# Patient Record
Sex: Male | Born: 1985
Health system: Southern US, Community
[De-identification: ages and names within clinical notes are randomized; demographics above are authoritative.]

## PROBLEM LIST (undated history)

## (undated) DIAGNOSIS — K219 Gastro-esophageal reflux disease without esophagitis: Secondary | ICD-10-CM

## (undated) DIAGNOSIS — Q283 Other malformations of cerebral vessels: Secondary | ICD-10-CM

## (undated) DIAGNOSIS — F419 Anxiety disorder, unspecified: Secondary | ICD-10-CM

## (undated) HISTORY — DX: Other malformations of cerebral vessels: Q28.3

## (undated) HISTORY — DX: Anxiety disorder, unspecified: F41.9

---
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
## 2003-04-13 ENCOUNTER — Encounter: Payer: Self-pay | Admitting: Emergency Medicine

## 2003-04-13 ENCOUNTER — Emergency Department (HOSPITAL_COMMUNITY): Admission: EM | Admit: 2003-04-13 | Discharge: 2003-04-13 | Payer: Self-pay | Admitting: Emergency Medicine

## 2008-07-14 ENCOUNTER — Emergency Department (HOSPITAL_COMMUNITY): Admission: EM | Admit: 2008-07-14 | Discharge: 2008-07-14 | Payer: Self-pay | Admitting: Family Medicine

## 2011-06-13 LAB — POCT RAPID STREP A: Streptococcus, Group A Screen (Direct): NEGATIVE

## 2013-02-25 ENCOUNTER — Encounter: Payer: Self-pay | Admitting: Family Medicine

## 2013-02-25 ENCOUNTER — Ambulatory Visit (INDEPENDENT_AMBULATORY_CARE_PROVIDER_SITE_OTHER): Payer: BC Managed Care – PPO | Admitting: Family Medicine

## 2013-02-25 VITALS — BP 122/90 | HR 84 | Temp 98.1°F | Resp 12 | Ht 69.5 in | Wt 164.0 lb

## 2013-02-25 DIAGNOSIS — R202 Paresthesia of skin: Secondary | ICD-10-CM

## 2013-02-25 DIAGNOSIS — Z23 Encounter for immunization: Secondary | ICD-10-CM

## 2013-02-25 DIAGNOSIS — M6740 Ganglion, unspecified site: Secondary | ICD-10-CM

## 2013-02-25 DIAGNOSIS — R209 Unspecified disturbances of skin sensation: Secondary | ICD-10-CM

## 2013-02-25 NOTE — Progress Notes (Signed)
  Subjective:    Patient ID: Luke Mcgrath, male    DOB: [birth year], 29 y.o.   MRN: [id number]  HPI Patient here to establish care Generally very healthy. No chronic medical problems. Takes no regular medications. Seen for the following issues:  Patient describes injury back in mid-January. 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 Went to chiropractor and had several treatments which did not help. MRI lumbar spine reportedly normal. Subsequently saw orthopedist and had MRI pelvis again reportedly normal Briefly, he took gabapentin for burning pain left thigh.  He has never had significant weakness. He continues to feel slight burning/stinging parent left anterior thigh. Ambulating without difficulty. Minimal pain.  Second issue is ganglion type cyst right hand. Right-hand dominant. Location is proximal to second digit palm of hand. Minimally painful.  Patient is married. 2 children. Nonsmoker. Rare alcohol use. Works as a Curator  Past Medical History  Diagnosis Date  . Numbness in left leg    Past Surgical History  Procedure Laterality Date  . Brain surgery  1990  . Reconstruction of nose  1994    reports that he has never smoked. He does not have any smokeless tobacco history on file. His alcohol and drug histories are not on file. family history includes Cancer in his maternal grandmother; Diabetes in his father; Hyperlipidemia in his father and maternal grandmother; and Hypertension in his father. No Known Allergies    Review of Systems  Constitutional: Negative for fever, appetite change and unexpected weight change.  Respiratory: Negative for shortness of breath.   Cardiovascular: Negative for chest pain.  Neurological: Positive for numbness. Negative for weakness.  Hematological: Negative for  adenopathy.       Objective:   Physical Exam  Constitutional: He appears well-developed and well-nourished.  HENT:  Right Ear: External ear normal.  Left Ear: External ear normal.  Mouth/Throat: Oropharynx is clear and moist.  Neck: Neck supple. No thyromegaly present.  Cardiovascular: Normal rate and regular rhythm.   No murmur heard. Pulmonary/Chest: Effort normal and breath sounds normal. No respiratory distress. He has no wheezes. He has no rales.  Musculoskeletal:  Patient has mobile minimally tender cystic lesion palm right hand proximal to second MCP joint  No evidence for muscle atrophy right lower extremity or left lower extremity. Good distal foot pulses. Full range of motion all joints left lower extremity  Neurological:  Patient's full strength left lower extremity. Slightly decreased left knee reflex compared to right. Symmetric ankle reflex. Minimal sensory impairment to touch left anterior thigh          Assessment & Plan:  #1 left anterior thigh dysesthesias. Sounds like he had femoral nerve injury back in January and this is slowly improving. We discussed possible neurology referral but at this point since he is improving and not have a significant pain or weakness we recommend giving this more time. We explained that may take several months to heal #2 benign ganglion cyst right hand. Reassurance

## 2013-02-25 NOTE — Patient Instructions (Addendum)
Ganglion Cyst A ganglion cyst is a noncancerous, fluid-filled lump that occurs near joints or tendons. The ganglion cyst grows out of a joint or the lining of a tendon. It most often develops in the hand or wrist but can also develop in the shoulder, elbow, hip, knee, ankle, or foot. The round or oval ganglion can be pea sized or larger than a grape. Increased activity may enlarge the size of the cyst because more fluid starts to build up.  CAUSES  It is not completely known what causes a ganglion cyst to grow. However, it may be related to:  Inflammation or irritation around the joint.  An injury.  Repetitive movements or overuse.  Arthritis. SYMPTOMS  A lump most often appears in the hand or wrist, but can occur in other areas of the body. Generally, the lump is painless without other symptoms. However, sometimes pain can be felt during activity or when pressure is applied to the lump. The lump may even be tender to the touch. Tingling, pain, numbness, or muscle weakness can occur if the ganglion cyst presses on a nerve. Your grip may be weak and you may have less movement in your joints.  DIAGNOSIS  Ganglion cysts are most often diagnosed based on a physical exam, noting where the cyst is and how it looks. Your caregiver will feel the lump and may shine a light alongside it. If it is a ganglion, a light often shines through it. Your caregiver may order an X-ray, ultrasound, or MRI to rule out other conditions. TREATMENT  Ganglions usually go away on their own without treatment. If pain or other symptoms are involved, treatment may be needed. Treatment is also needed if the ganglion limits your movement or if it gets infected. Treatment options include:  Wearing a wrist or finger brace or splint.  Taking anti-inflammatory medicine.  Draining fluid from the lump with a needle (aspiration).  Injecting a steroid into the joint.  Surgery to remove the ganglion cyst and its stalk that is  attached to the joint or tendon. However, ganglion cysts can grow back. HOME CARE INSTRUCTIONS   Do not press on the ganglion, poke it with a needle, or hit it with a heavy object. You may rub the lump gently and often. Sometimes fluid moves out of the cyst.  Only take medicines as directed by your caregiver.  Wear your brace or splint as directed by your caregiver. SEEK MEDICAL CARE IF:   Your ganglion becomes larger or more painful.  You have increased redness, red streaks, or swelling.  You have pus coming from the lump.  You have weakness or numbness in the affected area. MAKE SURE YOU:   Understand these instructions.  Will watch your condition.  Will get help right away if you are not doing well or get worse. Document Released: 08/25/2000 Document Revised: 05/22/2012 Document Reviewed: 10/22/2007 [facility] Patient Information 2014 [place], [place].  Schedule complete physical at some point this year.

## 2013-04-24 ENCOUNTER — Telehealth: Payer: Self-pay | Admitting: Family Medicine

## 2013-04-24 MED ORDER — SCOPOLAMINE 1 MG/3DAYS TD PT72: 1.0000 | MEDICATED_PATCH | TRANSDERMAL | Status: DC | PRN

## 2013-04-24 NOTE — Telephone Encounter (Signed)
Sent rx to pharmacy

## 2013-04-24 NOTE — Telephone Encounter (Signed)
Scopolamine patch one patch behind the ear every 72 hours as needed, dispense 6 patches

## 2013-04-24 NOTE — Telephone Encounter (Signed)
Pt is going on cruise and would like a rx for scopoline (nausea) patches.  [facility]/ [address]

## 2013-05-23 ENCOUNTER — Other Ambulatory Visit (INDEPENDENT_AMBULATORY_CARE_PROVIDER_SITE_OTHER): Payer: BC Managed Care – PPO

## 2013-05-23 DIAGNOSIS — R7989 Other specified abnormal findings of blood chemistry: Secondary | ICD-10-CM

## 2013-05-23 DIAGNOSIS — Z0000 Encounter for general adult medical examination without abnormal findings: Secondary | ICD-10-CM

## 2013-05-23 LAB — POCT URINALYSIS DIPSTICK
Bilirubin, UA: NEGATIVE
Blood, UA: NEGATIVE
Glucose, UA: NEGATIVE
Ketones, UA: NEGATIVE
Leukocytes, UA: NEGATIVE
pH, UA: 6

## 2013-05-23 LAB — CBC WITH DIFFERENTIAL/PLATELET
Eosinophils Absolute: 0.2 10*3/uL (ref 0.0–0.7)
MCHC: 34.3 g/dL (ref 30.0–36.0)
MCV: 87.6 fl (ref 78.0–100.0)
Monocytes Absolute: 0.5 10*3/uL (ref 0.1–1.0)
Neutrophils Relative %: 55.3 % (ref 43.0–77.0)
Platelets: 192 10*3/uL (ref 150.0–400.0)
RDW: 12.3 % (ref 11.5–14.6)

## 2013-05-23 LAB — HEPATIC FUNCTION PANEL
ALT: 11 U/L (ref 0–53)
Alkaline Phosphatase: 58 U/L (ref 39–117)
Bilirubin, Direct: 0.1 mg/dL (ref 0.0–0.3)
Total Bilirubin: 0.8 mg/dL (ref 0.3–1.2)
Total Protein: 7.5 g/dL (ref 6.0–8.3)

## 2013-05-23 LAB — LIPID PANEL
Cholesterol: 204 mg/dL — ABNORMAL HIGH (ref 0–200)
Triglycerides: 68 mg/dL (ref 0.0–149.0)

## 2013-05-23 LAB — BASIC METABOLIC PANEL
BUN: 17 mg/dL (ref 6–23)
CO2: 29 mEq/L (ref 19–32)
Chloride: 106 mEq/L (ref 96–112)
Creatinine, Ser: 0.9 mg/dL (ref 0.4–1.5)
Glucose, Bld: 97 mg/dL (ref 70–99)

## 2013-05-29 ENCOUNTER — Encounter: Payer: Self-pay | Admitting: Family Medicine

## 2013-05-29 ENCOUNTER — Ambulatory Visit (INDEPENDENT_AMBULATORY_CARE_PROVIDER_SITE_OTHER): Payer: BC Managed Care – PPO | Admitting: Family Medicine

## 2013-05-29 VITALS — BP 132/80 | HR 94 | Temp 97.9°F | Ht 69.0 in | Wt 167.0 lb

## 2013-05-29 DIAGNOSIS — Z0000 Encounter for general adult medical examination without abnormal findings: Secondary | ICD-10-CM

## 2013-05-29 DIAGNOSIS — Z23 Encounter for immunization: Secondary | ICD-10-CM

## 2013-05-29 MED ORDER — DULOXETINE HCL 60 MG PO CPEP: 60.0000 mg | ORAL_CAPSULE | Freq: Every day | ORAL | Status: DC

## 2013-05-29 MED ORDER — DULOXETINE HCL 30 MG PO CPEP: 30.0000 mg | ORAL_CAPSULE | Freq: Every day | ORAL | Status: DC

## 2013-05-29 NOTE — Progress Notes (Signed)
  Subjective:    Patient ID: Luke Mcgrath, male    DOB: 1985/12/18, 27 y.o.   MRN: [id number]  HPI  Patient here for physical exam. Generally very healthy. Refer to prior note. He's had ongoing issues with left lower extremity numbness and pain which sounds very much neuropathic. Previous MRI lumbar spine reportedly normal.  He took gabapentin per orthopedist but had severe sedation. He currently takes no regular medications. Last tetanus earlier this year. Requesting flu vaccine  Patient has intermittent GERD symptoms. Generally controlled with over-the-counter medications. He thinks this is anxiety related. He has tremendous anxiety and stress related to work.  Past Medical History  Diagnosis Date  . Numbness in left leg    Past Surgical History  Procedure Laterality Date  . Brain surgery  1990  . Reconstruction of nose  1994    reports that he has never smoked. He does not have any smokeless tobacco history on file. His alcohol and drug histories are not on file. family history includes Cancer in his maternal grandmother; Diabetes in his father; Hyperlipidemia in his father and maternal grandmother; Hypertension in his father. No Known Allergies    Review of Systems  Constitutional: Negative for fever, activity change, appetite change and fatigue.  HENT: Negative for ear pain, congestion and trouble swallowing.   Eyes: Negative for pain and visual disturbance.  Respiratory: Negative for cough, shortness of breath and wheezing.   Cardiovascular: Negative for chest pain and palpitations.  Gastrointestinal: Negative for nausea, vomiting, abdominal pain, diarrhea, constipation, blood in stool, abdominal distention and rectal pain.  Genitourinary: Negative for dysuria, hematuria and testicular pain.  Musculoskeletal: Negative for joint swelling and arthralgias.  Skin: Negative for rash.  Neurological: Negative for dizziness, syncope and headaches.  Hematological: Negative for  adenopathy.  Psychiatric/Behavioral: Negative for confusion and dysphoric mood. The patient is nervous/anxious.        Objective:   Physical Exam  Constitutional: He is oriented to person, place, and time. He appears well-developed and well-nourished. No distress.  HENT:  Head: Normocephalic and atraumatic.  Right Ear: External ear normal.  Left Ear: External ear normal.  Mouth/Throat: Oropharynx is clear and moist.  Eyes: Conjunctivae and EOM are normal. Pupils are equal, round, and reactive to light.  Neck: Normal range of motion. Neck supple. No thyromegaly present.  Cardiovascular: Normal rate, regular rhythm and normal heart sounds.   No murmur heard. Pulmonary/Chest: No respiratory distress. He has no wheezes. He has no rales.  Abdominal: Soft. Bowel sounds are normal. He exhibits no distension and no mass. There is no tenderness. There is no rebound and no guarding.  Musculoskeletal: He exhibits no edema.  Lymphadenopathy:    He has no cervical adenopathy.  Neurological: He is alert and oriented to person, place, and time. He displays normal reflexes. No cranial nerve deficit.  Deep tender reflexes are more brisk right knee compared to left. No focal weakness No LLE muscle atrophy.  Skin: No rash noted.  Psychiatric: He has a normal mood and affect.          Assessment & Plan:  Complete physical. Labs reviewed with patient. Only minimal hyperlipidemia. Flu vaccine given. GERD issues discussed with lifestyle management.  He had some ongoing neuropathic type pains left lower extremity following work injury. We discussed options such as Cymbalta given his prior intolerance of gabapentin. Start Cymbalta 30 mg once daily for one week then titrate to 60 mg once daily. Reassess one month

## 2013-05-29 NOTE — Patient Instructions (Addendum)
Gastroesophageal Reflux Disease, Adult  Gastroesophageal reflux disease (GERD) happens when acid from your stomach flows up into the esophagus. When acid comes in contact with the esophagus, the acid causes soreness (inflammation) in the esophagus. Over time, GERD may create small holes (ulcers) in the lining of the esophagus.  CAUSES   · Increased body weight. This puts pressure on the stomach, making acid rise from the stomach into the esophagus.  · Smoking. This increases acid production in the stomach.  · Drinking alcohol. This causes decreased pressure in the lower esophageal sphincter (valve or ring of muscle between the esophagus and stomach), allowing acid from the stomach into the esophagus.  · Late evening meals and a full stomach. This increases pressure and acid production in the stomach.  · A malformed lower esophageal sphincter.  Sometimes, no cause is found.  SYMPTOMS   · Burning pain in the lower part of the mid-chest behind the breastbone and in the mid-stomach area. This may occur twice a week or more often.  · Trouble swallowing.  · Sore throat.  · Dry cough.  · Asthma-like symptoms including chest tightness, shortness of breath, or wheezing.  DIAGNOSIS   Your caregiver may be able to diagnose GERD based on your symptoms. In some cases, X-rays and other tests may be done to check for complications or to check the condition of your stomach and esophagus.  TREATMENT   Your caregiver may recommend over-the-counter or prescription medicines to help decrease acid production. Ask your caregiver before starting or adding any new medicines.   HOME CARE INSTRUCTIONS   · Change the factors that you can control. Ask your caregiver for guidance concerning weight loss, quitting smoking, and alcohol consumption.  · Avoid foods and drinks that make your symptoms worse, such as:  · Caffeine or alcoholic drinks.  · Chocolate.  · Peppermint or mint flavorings.  · Garlic and onions.  · Spicy foods.  · Citrus fruits,  such as oranges, lemons, or limes.  · Tomato-based foods such as sauce, chili, salsa, and pizza.  · Fried and fatty foods.  · Avoid lying down for the 3 hours prior to your bedtime or prior to taking a nap.  · Eat small, frequent meals instead of large meals.  · Wear loose-fitting clothing. Do not wear anything tight around your waist that causes pressure on your stomach.  · Raise the head of your bed 6 to 8 inches with wood blocks to help you sleep. Extra pillows will not help.  · Only take over-the-counter or prescription medicines for pain, discomfort, or fever as directed by your caregiver.  · Do not take aspirin, ibuprofen, or other nonsteroidal anti-inflammatory drugs (NSAIDs).  SEEK IMMEDIATE MEDICAL CARE IF:   · You have pain in your arms, neck, jaw, teeth, or back.  · Your pain increases or changes in intensity or duration.  · You develop nausea, vomiting, or sweating (diaphoresis).  · You develop shortness of breath, or you faint.  · Your vomit is green, yellow, black, or looks like coffee grounds or blood.  · Your stool is red, bloody, or black.  These symptoms could be signs of other problems, such as heart disease, gastric bleeding, or esophageal bleeding.  MAKE SURE YOU:   · Understand these instructions.  · Will watch your condition.  · Will get help right away if you are not doing well or get worse.  Document Released: 06/07/2005 Document Revised: 11/20/2011 Document Reviewed: 03/17/2011  [employer]® Patient   Information ©2014 [employer].

## 2013-06-30 ENCOUNTER — Encounter: Payer: Self-pay | Admitting: Family Medicine

## 2013-06-30 ENCOUNTER — Ambulatory Visit (INDEPENDENT_AMBULATORY_CARE_PROVIDER_SITE_OTHER): Payer: BC Managed Care – PPO | Admitting: Family Medicine

## 2013-06-30 VITALS — BP 126/74 | HR 95 | Temp 98.2°F | Wt 171.0 lb

## 2013-06-30 DIAGNOSIS — F419 Anxiety disorder, unspecified: Secondary | ICD-10-CM

## 2013-06-30 DIAGNOSIS — F411 Generalized anxiety disorder: Secondary | ICD-10-CM

## 2013-06-30 DIAGNOSIS — M792 Neuralgia and neuritis, unspecified: Secondary | ICD-10-CM

## 2013-06-30 DIAGNOSIS — G5790 Unspecified mononeuropathy of unspecified lower limb: Secondary | ICD-10-CM

## 2013-06-30 NOTE — Patient Instructions (Signed)
We will call you regarding neurology appointment.

## 2013-06-30 NOTE — Progress Notes (Signed)
  Subjective:    Patient ID: Luke Mcgrath, male    DOB: Aug 27, 1986, 27 y.o.   MRN: [id number]  HPI  Patient seen for followup regarding recent anxiety issues and persistent left lower extremity pain Refer to last note. Back in January of 2014 he was lifting a transmission at work and felt a "popping" sensation left groin. This was followed by burning pain and some numbness left anterior thigh. He also noticed decreased sensory to touch and temperature. He was seen initially by chiropractor and subsequently orthopedist. He relates history of MRI lumbar spine and MRI pelvis which were unremarkable. He was given trial of gabapentin and had increased sedation and discontinued.  He continues to have burning pain 6-8/10 severity. Worse with ambulation. He has not had any muscle atrophy or definite weakness.  Was recently seen and complained of some generalized anxiety type symptoms. We decided to try Cymbalta both to treat his anxiety symptoms and hopefully treat his neuropathic-type pain. His anxiety has improved greatly since starting Cymbalta 60 mg daily but he has not seen any improvement in his left lower extremity pain. He has not had any kind of neurology evaluation.  Past Medical History  Diagnosis Date  . Numbness in left leg    Past Surgical History  Procedure Laterality Date  . Brain surgery  1990  . Reconstruction of nose  1994    reports that he has never smoked. He does not have any smokeless tobacco history on file. His alcohol and drug histories are not on file. family history includes Cancer in his maternal grandmother; Diabetes in his father; Hyperlipidemia in his father and maternal grandmother; Hypertension in his father. No Known Allergies]   Review of Systems  Constitutional: Negative for appetite change and unexpected weight change.  Respiratory: Negative for shortness of breath.   Hematological: Negative for adenopathy. Does not bruise/bleed easily.   Psychiatric/Behavioral: Negative for dysphoric mood. The patient is nervous/anxious.        Objective:   Physical Exam  Constitutional: He appears well-developed and well-nourished.  Cardiovascular: Normal rate and regular rhythm.   Pulmonary/Chest: Effort normal and breath sounds normal. No respiratory distress. He has no wheezes. He has no rales.  Musculoskeletal: He exhibits no edema.  No muscle atrophy lower extremities.  Neurological:  Full-strength lower extremities throughout. Symmetric reflexes  Psychiatric: He has a normal mood and affect. His behavior is normal.          Assessment & Plan:  #1 persistent left lower extremity neuropathic type pain /dysesthesias following acute injury several months ago as above. Previous MRI lumbar spine unrevealing. Set up neurology referral for further evaluation.  Unfortunately, his pain has not improved with Cymbalta. Previous intolerance to gabapentin. ?candidate for peripheral nerve conduction studies. #2 history of chronic anxiety. Improved on Cymbalta.

## 2013-07-01 DIAGNOSIS — M792 Neuralgia and neuritis, unspecified: Secondary | ICD-10-CM | POA: Insufficient documentation

## 2013-07-01 DIAGNOSIS — F419 Anxiety disorder, unspecified: Secondary | ICD-10-CM | POA: Insufficient documentation

## 2013-07-18 ENCOUNTER — Ambulatory Visit (INDEPENDENT_AMBULATORY_CARE_PROVIDER_SITE_OTHER): Payer: No Typology Code available for payment source | Admitting: Neurology

## 2013-07-18 ENCOUNTER — Encounter: Payer: Self-pay | Admitting: Neurology

## 2013-07-18 VITALS — BP 114/70 | HR 78 | Temp 97.9°F | Ht 71.0 in | Wt 171.0 lb

## 2013-07-18 DIAGNOSIS — R292 Abnormal reflex: Secondary | ICD-10-CM

## 2013-07-18 DIAGNOSIS — M79609 Pain in unspecified limb: Secondary | ICD-10-CM

## 2013-07-18 DIAGNOSIS — R209 Unspecified disturbances of skin sensation: Secondary | ICD-10-CM

## 2013-07-18 DIAGNOSIS — G959 Disease of spinal cord, unspecified: Secondary | ICD-10-CM

## 2013-07-18 LAB — VITAMIN B12: Vitamin B-12: 378 pg/mL (ref 211–911)

## 2013-07-18 LAB — VITAMIN D 25 HYDROXY (VIT D DEFICIENCY, FRACTURES): Vit D, 25-Hydroxy: 38 ng/mL (ref 30–89)

## 2013-07-18 MED ORDER — PREGABALIN 50 MG PO CAPS: ORAL_CAPSULE | ORAL | Status: DC
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
## 2013-07-18 NOTE — Patient Instructions (Addendum)
1.  MRI brain, cervical, and thoracic wwo contrast is scheduled for Tuesday November 11th at 2:45pm at [facility]. [phone] 2.  Check labs today 3.  Please forward CD imagines of the MRI lumbar spine 4.  Samples of Lyrica provided

## 2013-07-21 LAB — SJOGRENS SYNDROME-A EXTRACTABLE NUCLEAR ANTIBODY: SSA (Ro) (ENA) Antibody, IgG: 5 AU/mL (ref <30)

## 2013-07-22 ENCOUNTER — Ambulatory Visit (HOSPITAL_COMMUNITY): Payer: BC Managed Care – PPO

## 2013-07-22 ENCOUNTER — Ambulatory Visit (HOSPITAL_COMMUNITY)
Admission: RE | Admit: 2013-07-22 | Discharge: 2013-07-22 | Disposition: A | Discharge status: Home / Self Care | Payer: No Typology Code available for payment source | Source: Ambulatory Visit | Attending: Neurology | Admitting: Neurology

## 2013-07-22 ENCOUNTER — Ambulatory Visit (HOSPITAL_COMMUNITY)
Admission: RE | Admit: 2013-07-22 | Discharge: 2013-07-22 | Disposition: A | Discharge status: Home / Self Care | Payer: BC Managed Care – PPO | Source: Ambulatory Visit | Attending: Neurology | Admitting: Neurology

## 2013-07-22 DIAGNOSIS — G9389 Other specified disorders of brain: Secondary | ICD-10-CM | POA: Insufficient documentation

## 2013-07-22 DIAGNOSIS — M79609 Pain in unspecified limb: Secondary | ICD-10-CM

## 2013-07-22 DIAGNOSIS — R209 Unspecified disturbances of skin sensation: Secondary | ICD-10-CM | POA: Insufficient documentation

## 2013-07-22 DIAGNOSIS — R292 Abnormal reflex: Secondary | ICD-10-CM

## 2013-07-22 DIAGNOSIS — G959 Disease of spinal cord, unspecified: Secondary | ICD-10-CM

## 2013-07-22 LAB — METHYLMALONIC ACID, SERUM: Methylmalonic Acid, Quant: 0.14 umol/L (ref <0.40)

## 2013-07-22 MED ORDER — GADOBENATE DIMEGLUMINE 529 MG/ML IV SOLN
15.0000 mL | Freq: Once | INTRAVENOUS | Status: AC | PRN
  Administered 2013-07-22: 15 mL via INTRAVENOUS

## 2013-07-23 ENCOUNTER — Telehealth: Payer: Self-pay | Admitting: Neurology

## 2013-07-23 ENCOUNTER — Ambulatory Visit (HOSPITAL_COMMUNITY): Payer: BC Managed Care – PPO

## 2013-07-23 NOTE — Telephone Encounter (Signed)
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  I would like for him to be seen by interventional neurologist.  I called and scheduled an appointment for him to be seen with Dr. Conchita Paris for tomorrow, 07/24/2013 at 3:15pm.  Patient has been notified of the appointment.  Keshauna Degraffenreid K. Allena Katz, DO

## 2013-07-23 NOTE — Telephone Encounter (Signed)
Office note and mri results faxed

## 2013-08-01 ENCOUNTER — Encounter: Payer: Self-pay | Admitting: Neurology

## 2013-08-01 ENCOUNTER — Ambulatory Visit (INDEPENDENT_AMBULATORY_CARE_PROVIDER_SITE_OTHER): Payer: No Typology Code available for payment source | Admitting: Neurology

## 2013-08-01 VITALS — BP 115/72 | HR 80 | Temp 97.6°F | Ht 72.0 in | Wt 175.1 lb

## 2013-08-01 DIAGNOSIS — Q288 Other specified congenital malformations of circulatory system: Secondary | ICD-10-CM | POA: Insufficient documentation
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
## 2013-09-11 DIAGNOSIS — Q283 Other malformations of cerebral vessels: Secondary | ICD-10-CM

## 2013-09-11 HISTORY — PX: CERVICAL LAMINECTOMY FOR EXCISION OF NEOPLASM: SHX1332

## 2013-09-11 HISTORY — DX: Other malformations of cerebral vessels: Q28.3

## 2013-09-16 ENCOUNTER — Telehealth: Payer: Self-pay | Admitting: Family Medicine

## 2013-09-16 NOTE — Telephone Encounter (Signed)
Pt needs PA on cymbalta [facility] [address]. Pt has conv[employer] ins

## 2013-09-18 NOTE — Telephone Encounter (Signed)
Pt's wife calling on behalf of pt to check status of PA for cymbalta, requesting to know if PA has already been initiated.

## 2013-09-25 NOTE — Telephone Encounter (Signed)
PA approved.

## 2013-10-03 ENCOUNTER — Ambulatory Visit: Payer: No Typology Code available for payment source | Admitting: Neurology

## 2013-10-06 DIAGNOSIS — K219 Gastro-esophageal reflux disease without esophagitis: Secondary | ICD-10-CM | POA: Insufficient documentation

## 2013-12-23 ENCOUNTER — Telehealth: Payer: Self-pay | Admitting: Neurology

## 2013-12-23 NOTE — Telephone Encounter (Signed)
Pt wants a referral to another  Another dr office  The information is as follows Dr Ace Gins fax number [phone] telephone [phone]

## 2013-12-24 ENCOUNTER — Telehealth: Payer: Self-pay | Admitting: *Deleted

## 2013-12-24 NOTE — Telephone Encounter (Signed)
Luke Mcgrath, please call patient and verify that the referral is for chronic pain with Dr. Unice Bailey office.  If this is accurate, please place referral to them.  Thank you.  Luke Steinruck K. Posey Pronto, DO

## 2013-12-24 NOTE — Telephone Encounter (Signed)
Please advise. Thanks.  

## 2013-12-24 NOTE — Telephone Encounter (Signed)
I spoke with pt and he does want referral for pain.  Called Dr. Unice Bailey office and they instructed me to fax notes for referral and they will contact pt after notes have been reviewed.

## 2013-12-24 NOTE — Telephone Encounter (Signed)
Office note already faxed.  [facility] is also referring so they will send their notes.

## 2013-12-24 NOTE — Telephone Encounter (Signed)
Noted.  Please send them my clinic note and recent OP note from [facility]. Thanks.  Denetra Formoso K. Posey Pronto, DO

## 2013-12-29 NOTE — Telephone Encounter (Signed)
abc

## 2014-01-14 ENCOUNTER — Telehealth: Payer: Self-pay | Admitting: Neurology

## 2014-01-14 NOTE — Telephone Encounter (Signed)
Ok to refer.

## 2014-01-14 NOTE — Telephone Encounter (Signed)
I faxed referral and notes to [facility].

## 2014-01-14 NOTE — Telephone Encounter (Signed)
Pt called requesting a referral to see a pain management provider who is in network with his insurance [employer].

## 2014-01-14 NOTE — Telephone Encounter (Signed)
Absolutely.   Johnedward Brodrick K. Posey Pronto, DO

## 2014-01-14 NOTE — Telephone Encounter (Signed)
Patient notified

## 2014-02-18 ENCOUNTER — Encounter: Payer: Self-pay | Admitting: *Deleted

## 2014-02-18 ENCOUNTER — Telehealth: Payer: Self-pay | Admitting: Neurology

## 2014-02-18 NOTE — Telephone Encounter (Signed)
Spoke with patient and he was requesting another referral for pain management. Dr. Posey Pronto had ok'd this in the past.  I informed him that I sent a referral over in the past to [facility].  He said no one ever contacted him.  Another referral sent today.  Patient aware.

## 2014-02-18 NOTE — Telephone Encounter (Signed)
Pt needs to talk to someone about a referral please call [phone]

## 2014-02-27 ENCOUNTER — Ambulatory Visit: Payer: No Typology Code available for payment source | Admitting: Neurology

## 2014-03-05 ENCOUNTER — Ambulatory Visit (INDEPENDENT_AMBULATORY_CARE_PROVIDER_SITE_OTHER): Payer: No Typology Code available for payment source | Admitting: Neurology

## 2014-03-05 ENCOUNTER — Encounter: Payer: Self-pay | Admitting: Neurology

## 2014-03-05 VITALS — BP 124/80 | HR 75 | Ht 70.47 in | Wt 175.2 lb

## 2014-03-05 DIAGNOSIS — Q283 Other malformations of cerebral vessels: Secondary | ICD-10-CM

## 2014-03-05 DIAGNOSIS — G959 Disease of spinal cord, unspecified: Secondary | ICD-10-CM

## 2014-03-05 DIAGNOSIS — M4712 Other spondylosis with myelopathy, cervical region: Secondary | ICD-10-CM

## 2014-03-05 DIAGNOSIS — M792 Neuralgia and neuritis, unspecified: Secondary | ICD-10-CM

## 2014-03-05 DIAGNOSIS — IMO0002 Reserved for concepts with insufficient information to code with codable children: Secondary | ICD-10-CM

## 2014-03-05 MED ORDER — BACLOFEN 10 MG PO TABS: 10.0000 mg | ORAL_TABLET | Freq: Every evening | ORAL | Status: DC | PRN

## 2014-03-05 NOTE — Patient Instructions (Addendum)
1.  Week 1:  Lyrica 50mg  twice daily      Week 2:  Lyrica 75mg  twice daily 2.  Start baclofen 10mg  at bedtime as needed for muscle tightness 3.  Call with update in two weeks to let me know how you are tolerating it 4.  We will contact you regarding pain management options 5.  Return to clinic 6-weeks
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
## 2014-03-20 ENCOUNTER — Encounter: Payer: Self-pay | Admitting: *Deleted

## 2014-03-20 ENCOUNTER — Telehealth: Payer: Self-pay | Admitting: *Deleted

## 2014-03-20 NOTE — Telephone Encounter (Signed)
OK to stop it.  Donika K. Posey Pronto, DO

## 2014-03-20 NOTE — Telephone Encounter (Signed)
Patient said that the Lyrica is making him dizzy and nauseas.  Please advise.

## 2014-03-20 NOTE — Telephone Encounter (Signed)
Faxed [facility] surgery notes per doctor's request.   Fax: [phone]

## 2014-03-20 NOTE — Telephone Encounter (Signed)
Left message giving patient instructions. 

## 2014-03-23 ENCOUNTER — Encounter: Payer: Self-pay | Admitting: *Deleted

## 2014-03-30 ENCOUNTER — Ambulatory Visit (INDEPENDENT_AMBULATORY_CARE_PROVIDER_SITE_OTHER): Payer: No Typology Code available for payment source | Admitting: Family Medicine

## 2014-03-30 ENCOUNTER — Encounter: Payer: Self-pay | Admitting: Family Medicine

## 2014-03-30 VITALS — BP 128/80 | HR 92 | Temp 98.4°F | Wt 175.0 lb

## 2014-03-30 DIAGNOSIS — M25569 Pain in unspecified knee: Secondary | ICD-10-CM

## 2014-03-30 DIAGNOSIS — M25561 Pain in right knee: Secondary | ICD-10-CM

## 2014-03-30 NOTE — Progress Notes (Signed)
Pre visit review using our clinic review tool, if applicable. No additional management support is needed unless otherwise documented below in the visit note. 

## 2014-03-30 NOTE — Progress Notes (Signed)
   Subjective:    Patient ID: Luke Mcgrath, male    DOB: 1986/05/06, 28 y.o.   MRN: [id number]  Knee Pain    Right knee pain. Onset about 2 weeks ago. He was at work and stepped into a metal plate. No laceration. Mild bruising. Able to ambulate afterwards. He now has a firm knot just above his right patella. No weakness or difficulties ambulating. No knee instability.   Review of Systems  Musculoskeletal: Negative for gait problem.       Objective:   Physical Exam  Constitutional: He appears well-developed and well-nourished.  Cardiovascular: Normal rate and regular rhythm.   Pulmonary/Chest: Effort normal and breath sounds normal. No respiratory distress. He has no wheezes. He has no rales.  Musculoskeletal:  Patient has full range of motion right knee. He has fairly firm slightly mobile nodular density just superior to the patella. Minimally tender to palpation. No effusion. Otherwise no bony tenderness. Ligament testing is normal          Assessment & Plan:  Right knee injury. Suspect small hematoma. Check x-rays. If normal, reassurance.

## 2014-03-31 ENCOUNTER — Ambulatory Visit (INDEPENDENT_AMBULATORY_CARE_PROVIDER_SITE_OTHER)
Admission: RE | Admit: 2014-03-31 | Discharge: 2014-03-31 | Disposition: A | Discharge status: Home / Self Care | Payer: No Typology Code available for payment source | Source: Ambulatory Visit | Attending: Family Medicine | Admitting: Family Medicine

## 2014-03-31 DIAGNOSIS — M25569 Pain in unspecified knee: Secondary | ICD-10-CM

## 2014-03-31 DIAGNOSIS — M25561 Pain in right knee: Secondary | ICD-10-CM

## 2014-04-17 ENCOUNTER — Ambulatory Visit (INDEPENDENT_AMBULATORY_CARE_PROVIDER_SITE_OTHER): Payer: No Typology Code available for payment source | Admitting: Neurology

## 2014-04-17 ENCOUNTER — Encounter: Payer: Self-pay | Admitting: Neurology

## 2014-04-17 VITALS — BP 124/90 | HR 77 | Ht 70.0 in | Wt 178.6 lb

## 2014-04-17 DIAGNOSIS — G959 Disease of spinal cord, unspecified: Secondary | ICD-10-CM

## 2014-04-17 DIAGNOSIS — IMO0002 Reserved for concepts with insufficient information to code with codable children: Secondary | ICD-10-CM

## 2014-04-17 DIAGNOSIS — Q288 Other specified congenital malformations of circulatory system: Secondary | ICD-10-CM

## 2014-04-17 DIAGNOSIS — M4712 Other spondylosis with myelopathy, cervical region: Secondary | ICD-10-CM

## 2014-04-17 DIAGNOSIS — M792 Neuralgia and neuritis, unspecified: Secondary | ICD-10-CM

## 2014-04-17 DIAGNOSIS — Q283 Other malformations of cerebral vessels: Secondary | ICD-10-CM

## 2014-04-17 NOTE — Patient Instructions (Signed)
1.  Start taking baclofen 10mg  at 6pm daily 2.  Continue amitriptyline 50mg  daily 3.  NCS/EMG of the lower extremities - schedule for September 4.  Telephone update with results 5.  Call me if you have any questions/concerns
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
## 2014-06-03 ENCOUNTER — Ambulatory Visit (INDEPENDENT_AMBULATORY_CARE_PROVIDER_SITE_OTHER): Payer: No Typology Code available for payment source | Admitting: Neurology

## 2014-06-03 DIAGNOSIS — M4712 Other spondylosis with myelopathy, cervical region: Secondary | ICD-10-CM

## 2014-06-03 DIAGNOSIS — Q283 Other malformations of cerebral vessels: Secondary | ICD-10-CM

## 2014-06-03 DIAGNOSIS — Q288 Other specified congenital malformations of circulatory system: Secondary | ICD-10-CM

## 2014-06-03 DIAGNOSIS — M792 Neuralgia and neuritis, unspecified: Secondary | ICD-10-CM

## 2014-06-03 DIAGNOSIS — G959 Disease of spinal cord, unspecified: Secondary | ICD-10-CM

## 2014-06-03 NOTE — Procedures (Signed)
[facility]  [address]  [place], [place] [address] Tel: [phone] Fax:  [phone] Test Date:  06/03/2014  Patient: Luke Mcgrath DOB: March 01, 1986 Physician: Narda Amber, DO  Sex: Male Height: 5\' 10"  Ref Phys: Narda Amber  ID#: [id number] Temp: 34.0 Technician:    Patient Complaints: This is a 28 year old male with history of cavernous malformation s/p resection with here for evaluation of persistent neuropathic pain of the legs.  NCV & EMG Findings: Extensive electrodiagnostic testing of the left lower extremity and additional studies of the right reveals:  1. Bilateral sural and superficial peroneal sensory responses are within normal limits.  2. Bilateral peroneal and tibial motor responses are within normal limits.  3. There is no evidence of active or chronic motor axonal loss changes affecting the tested muscles.  Motor unit recruitment and configuration is within normal limits.  Impression: This is a normal study of the lower extremities. In particular, there is no evidence of a generalized sensorimotor polyneuropathy or lumbosacral radiculopathy affecting the legs.   ___________________________ Narda Amber, DO    Nerve Conduction Studies Anti Sensory Summary Table   Site NR Peak (ms) Norm Peak (ms) P-T Amp (V) Norm P-T Amp  Left Sup Peroneal Anti Sensory (Ant Lat Mall)  12 cm    2.7 <4.5 7.2 >5  Right Sup Peroneal Anti Sensory (Ant Lat Mall)  12 cm    2.9 <4.5 10.7 >5  Left Sural Anti Sensory (Lat Mall)  Calf    3.6 <4.5 6.8 >5  Right Sural Anti Sensory (Lat Mall)  Site 2    3.3  6.7 >5   Motor Summary Table   Site NR Onset (ms) Norm Onset (ms) O-P Amp (mV) Norm O-P Amp Site1 Site2 Delta-0 (ms) Dist (cm) Vel (m/s) Norm Vel (m/s)  Left Peroneal Motor (Ext Dig Brev)  Ankle    3.7 <5.5 6.0 >3 B Fib Ankle 7.1 34.0 48 >40  B Fib    10.8  5.2  Poplt B Fib 2.3 10.0 43 >40  Poplt    13.1  5.1         Right Peroneal Motor (Ext Dig  Brev)  Ankle    4.6 <5.5 4.8 >3 B Fib Ankle 6.8 34.0 50 >40  B Fib    11.4  4.4  Poplt B Fib 1.9 10.0 53 >40  Poplt    13.3  4.1         Left Tibial Motor (Abd Hall Brev)  Ankle    3.8 <6.0 8.4 >8 Knee Ankle 8.8 40.0 45 >40  Knee    12.6  6.8         Right Tibial Motor (Abd Hall Brev)  Ankle    4.2 <6.0 8.7 >8 Knee Ankle 9.4 39.0 41 >40  Knee    13.6  5.6          F Wave Studies   NR F-Lat (ms) Lat Norm (ms) L-R F-Lat (ms)  Left Tibial (Mrkrs) (Abd Hallucis)     53.85 <55    H Reflex Studies   NR H-Lat (ms) Lat Norm (ms) L-R H-Lat (ms)  Left Tibial (Gastroc)     34.42 <35 0.00  Right Tibial (Gastroc)     34.42 <35 0.00   EMG   Side Muscle Ins Act Fibs Psw Fasc Number Recrt Dur Dur. Amp Amp. Poly Poly. Comment  Left AntTibialis Nml Nml Nml Nml Nml Nml Nml Nml Nml  Nml Nml Nml N/A  Left Gastroc Nml Nml Nml Nml Nml Nml Nml Nml Nml Nml Nml Nml N/A  Left Flex Dig Long Nml Nml Nml Nml Nml Nml Nml Nml Nml Nml Nml Nml N/A  Left RectFemoris Nml Nml Nml Nml Nml Nml Nml Nml Nml Nml Nml Nml N/A  Left BicepsFemS Nml Nml Nml Nml Nml Nml Nml Nml Nml Nml Nml Nml N/A  Left GluteusMed Nml Nml Nml Nml Nml Nml Nml Nml Nml Nml Nml Nml N/A  Right AntTibialis Nml Nml Nml Nml Nml Nml Nml Nml Nml Nml Nml Nml N/A  Right Gastroc Nml Nml Nml Nml Nml Nml Nml Nml Nml Nml Nml Nml N/A  Right RectFemoris Nml Nml Nml Nml Nml Nml Nml Nml Nml Nml Nml Nml N/A      Waveforms:

## 2016-04-16 ENCOUNTER — Ambulatory Visit (HOSPITAL_COMMUNITY)
Admission: AD | Admit: 2016-04-16 | Discharge: 2016-04-16 | Disposition: A | Discharge status: Home / Self Care | Payer: No Typology Code available for payment source | Attending: Psychiatry | Admitting: Psychiatry

## 2016-04-16 NOTE — BH Assessment (Addendum)
Tele Assessment Note   Luke Mcgrath is an 30 y.o. male was brought to [facility] as a walk-in by the [facility] Department after he attempted to drive under the influence tonight. Pt's wife called the sheriff department to keep the pt from driving. Pt reports that he had a conflict with his family tonight and was regretful of his attempt to drive. Pt denied depression, anxiety, SI, HI, Hx of violence, SA, A/V hallucinations. Pt reported increased stress due to life situations including new baby, job changes, moving, loss, chronic pain, and family conflict. Pt reported increased irritability and decreased sleep due to stress. Pt reported that he owned guns but they were locked up. Pt reported that he is currently in school for business. Pt denied seeing a therapist in the past or ever being inpatient in a psychiatric hospital. Pt requested outpatient referrals and refused the MSE. Per Serena Colonel, NP, pt does not meet criteria for inpatient. Pt was discharged with outpatient referrals.   Diagnosis: F43.20 Adjustment disorder, unspecified  Past Medical History:  Past Medical History:  Diagnosis Date  . Anxiety   . Cavernous malformation 09/2013   Cervical region    Past Surgical History:  Procedure Laterality Date  . BRAIN SURGERY  1990   age 22  . CERVICAL LAMINECTOMY FOR EXCISION OF NEOPLASM  2015   Cervical cavernoma at C6-7  . RECONSTRUCTION OF NOSE  1994    Family History:  Family History  Problem Relation Age of Onset  . Hyperlipidemia Father   . Hypertension Father   . Diabetes Father   . Cancer Maternal Grandmother     colon  . Hyperlipidemia Maternal Grandmother   . Healthy Mother   . Other Brother     Died, [employer] in [place]  . Healthy Sister     Social History:  reports that he has never smoked. He has never used smokeless tobacco. He reports that he drinks alcohol. He reports that he does not use drugs.  Additional Social History:  Alcohol / Drug Use Pain  Medications: pt denies Prescriptions: pt denies Over the Counter:  pt denies History of alcohol / drug use?: No history of alcohol / drug abuse  CIWA:   COWS:    PATIENT STRENGTHS: (choose at least two) Ability for insight Capable of independent living Communication skills Motivation for treatment/growth Physical Health Supportive family/friends Work skills  Allergies: No Known Allergies  Home Medications:  (Not in a hospital admission)  OB/GYN Status:  No LMP for male patient.  General Assessment Data Location of Assessment: [facility] Assessment Services TTS Assessment: In system Is this a Tele or Face-to-Face Assessment?: Face-to-Face Is this an Initial Assessment or a Re-assessment for this encounter?: Initial Assessment Marital status: Married Is patient pregnant?: No Pregnancy Status: No Living Arrangements: Spouse/significant other, Children (wife and children) Can pt return to current living arrangement?: Yes Admission Status: Voluntary Is patient capable of signing voluntary admission?: Yes Referral Source: Self/Family/Friend (wife called [employer]) Insurance type: Administrator, arts Exam ([place]) Medical Exam completed: No Reason for MSE not completed: Patient Refused  Crisis Care Plan Living Arrangements: Spouse/significant other, Children (wife and children)  Education Status Is patient currently in school?: Yes Highest grade of school patient has completed: some college  Risk to self with the past 6 months Suicidal Ideation: No Has patient been a risk to self within the past 6 months prior to admission? : No Suicidal Intent: No Has patient had any suicidal intent  within the past 6 months prior to admission? : No Is patient at risk for suicide?: No Suicidal Plan?: No Has patient had any suicidal plan within the past 6 months prior to admission? : No Access to Means: No What has been your use of drugs/alcohol within the last 12 months?:  none Previous Attempts/Gestures: No How many times?: 0 Other Self Harm Risks: none Triggers for Past Attempts: None known Intentional Self Injurious Behavior: None Family Suicide History: No Recent stressful life event(s): Conflict (Comment), Loss (Comment), Job Loss, Financial Problems, Turmoil (Comment) (chronic pain, family stress, moving, new baby) Persecutory voices/beliefs?: No Depression: No Depression Symptoms: Fatigue, Insomnia Substance abuse history and/or treatment for substance abuse?: No Suicide prevention information given to non-admitted patients: Not applicable  Risk to Others within the past 6 months Homicidal Ideation: No Does patient have any lifetime risk of violence toward others beyond the six months prior to admission? : No Thoughts of Harm to Others: No Current Homicidal Intent: No Current Homicidal Plan: No Access to Homicidal Means: Yes Describe Access to Homicidal Means: guns Identified Victim: none History of harm to others?: No Assessment of Violence: None Noted Violent Behavior Description: none Does patient have access to weapons?: Yes (Comment) Criminal Charges Pending?: No Does patient have a court date: No Is patient on probation?: No  Psychosis Hallucinations: None noted Delusions: None noted  Mental Status Report Appearance/Hygiene: Unremarkable Eye Contact: Good Motor Activity: Freedom of movement Speech: Logical/coherent, Rapid Level of Consciousness: Alert, Restless Mood: Anxious, Pleasant Affect: Appropriate to circumstance Anxiety Level: None Thought Processes: Coherent, Relevant Judgement: Unimpaired Orientation: Person, Place, Time, Situation Obsessive Compulsive Thoughts/Behaviors: None  Cognitive Functioning Concentration: Normal Memory: Recent Intact, Remote Intact IQ: Average Insight: Good Impulse Control: Good Appetite: Good Sleep: Decreased Total Hours of Sleep: 3 Vegetative Symptoms: None  ADLScreening [facility]  Assessment Services) Patient's cognitive ability adequate to safely complete daily activities?: Yes Patient able to express need for assistance with ADLs?: Yes Independently performs ADLs?: Yes (appropriate for developmental age)  Prior Inpatient Therapy Prior Inpatient Therapy: No  Prior Outpatient Therapy Prior Outpatient Therapy: No Does patient have an ACCT team?: No Does patient have Intensive In-House Services?  : No Does patient have [facility] services? : No Does patient have P4CC services?: No  ADL Screening (condition at time of admission) Patient's cognitive ability adequate to safely complete daily activities?: Yes Is the patient deaf or have difficulty hearing?: No Does the patient have difficulty seeing, even when wearing glasses/contacts?: No Does the patient have difficulty concentrating, remembering, or making decisions?: No Patient able to express need for assistance with ADLs?: Yes Does the patient have difficulty dressing or bathing?: No Independently performs ADLs?: Yes (appropriate for developmental age) Does the patient have difficulty walking or climbing stairs?: No       Abuse/Neglect Assessment (Assessment to be complete while patient is alone) Physical Abuse: Denies Verbal Abuse: Denies Sexual Abuse: Denies Exploitation of patient/patient's resources: Denies Self-Neglect: Denies     Regulatory affairs officer (For Healthcare) Does patient have an advance directive?: No Would patient like information on creating an advanced directive?: No - patient declined information    Additional Information 1:1 In Past 12 Months?: No CIRT Risk: No Elopement Risk: No Does patient have medical clearance?: No     Disposition:  Disposition Initial Assessment Completed for this Encounter: Yes Disposition of Patient: Outpatient treatment Type of outpatient treatment: Adult   Ruben Reason, MA, Alesia Banda, LCASA Therapeutic Triage Specialist [facility]  04/16/2016 2:09 AM

## 2016-06-26 DIAGNOSIS — Z79891 Long term (current) use of opiate analgesic: Secondary | ICD-10-CM | POA: Diagnosis not present

## 2016-06-26 DIAGNOSIS — Z79899 Other long term (current) drug therapy: Secondary | ICD-10-CM | POA: Diagnosis not present

## 2016-06-26 DIAGNOSIS — M79606 Pain in leg, unspecified: Secondary | ICD-10-CM | POA: Diagnosis not present

## 2016-06-26 DIAGNOSIS — G894 Chronic pain syndrome: Secondary | ICD-10-CM | POA: Diagnosis not present

## 2016-08-21 DIAGNOSIS — Z79891 Long term (current) use of opiate analgesic: Secondary | ICD-10-CM | POA: Diagnosis not present

## 2016-08-21 DIAGNOSIS — G894 Chronic pain syndrome: Secondary | ICD-10-CM | POA: Diagnosis not present

## 2016-08-21 DIAGNOSIS — M79606 Pain in leg, unspecified: Secondary | ICD-10-CM | POA: Diagnosis not present

## 2016-08-21 DIAGNOSIS — Z79899 Other long term (current) drug therapy: Secondary | ICD-10-CM | POA: Diagnosis not present

## 2016-09-13 DIAGNOSIS — M50323 Other cervical disc degeneration at C6-C7 level: Secondary | ICD-10-CM | POA: Diagnosis not present

## 2016-09-13 DIAGNOSIS — M9902 Segmental and somatic dysfunction of thoracic region: Secondary | ICD-10-CM | POA: Diagnosis not present

## 2016-09-13 DIAGNOSIS — M9901 Segmental and somatic dysfunction of cervical region: Secondary | ICD-10-CM | POA: Diagnosis not present

## 2016-09-13 DIAGNOSIS — M5136 Other intervertebral disc degeneration, lumbar region: Secondary | ICD-10-CM | POA: Diagnosis not present

## 2016-09-21 ENCOUNTER — Other Ambulatory Visit (INDEPENDENT_AMBULATORY_CARE_PROVIDER_SITE_OTHER): Payer: BLUE CROSS/BLUE SHIELD

## 2016-09-21 DIAGNOSIS — Z0000 Encounter for general adult medical examination without abnormal findings: Secondary | ICD-10-CM | POA: Diagnosis not present

## 2016-09-21 LAB — HEPATIC FUNCTION PANEL
ALT: 15 U/L (ref 0–53)
AST: 16 U/L (ref 0–37)
Albumin: 4.6 g/dL (ref 3.5–5.2)
Alkaline Phosphatase: 77 U/L (ref 39–117)
BILIRUBIN TOTAL: 0.5 mg/dL (ref 0.2–1.2)
Bilirubin, Direct: 0.1 mg/dL (ref 0.0–0.3)
Total Protein: 7.3 g/dL (ref 6.0–8.3)

## 2016-09-21 LAB — BASIC METABOLIC PANEL
BUN: 15 mg/dL (ref 6–23)
CO2: 32 meq/L (ref 19–32)
CREATININE: 0.97 mg/dL (ref 0.40–1.50)
Calcium: 9.9 mg/dL (ref 8.4–10.5)
Chloride: 102 mEq/L (ref 96–112)
GFR: 95.04 mL/min (ref >60.00)
Glucose, Bld: 102 mg/dL — ABNORMAL HIGH (ref 70–99)
Potassium: 4.9 mEq/L (ref 3.5–5.1)
Sodium: 140 mEq/L (ref 135–145)

## 2016-09-21 LAB — LIPID PANEL
Cholesterol: 203 mg/dL — ABNORMAL HIGH (ref 0–200)
HDL: 54.2 mg/dL (ref >39.00)
LDL Cholesterol: 124 mg/dL — ABNORMAL HIGH (ref 0–99)
NONHDL: 148.67
Total CHOL/HDL Ratio: 4
Triglycerides: 122 mg/dL (ref 0.0–149.0)
VLDL: 24.4 mg/dL (ref 0.0–40.0)

## 2016-09-21 LAB — CBC WITH DIFFERENTIAL/PLATELET
Basophils Absolute: 0 10*3/uL (ref 0.0–0.1)
Basophils Relative: 0.5 % (ref 0.0–3.0)
EOS ABS: 0.2 10*3/uL (ref 0.0–0.7)
Eosinophils Relative: 3.3 % (ref 0.0–5.0)
HCT: 43.3 % (ref 39.0–52.0)
Hemoglobin: 15 g/dL (ref 13.0–17.0)
Lymphocytes Relative: 24.9 % (ref 12.0–46.0)
Lymphs Abs: 1.2 10*3/uL (ref 0.7–4.0)
MCHC: 34.7 g/dL (ref 30.0–36.0)
MCV: 89.7 fl (ref 78.0–100.0)
MONO ABS: 0.4 10*3/uL (ref 0.1–1.0)
Monocytes Relative: 9 % (ref 3.0–12.0)
NEUTROS ABS: 3 10*3/uL (ref 1.4–7.7)
NEUTROS PCT: 62.3 % (ref 43.0–77.0)
Platelets: 196 10*3/uL (ref 150.0–400.0)
RBC: 4.83 Mil/uL (ref 4.22–5.81)
RDW: 12.3 % (ref 11.5–15.5)
WBC: 4.9 10*3/uL (ref 4.0–10.5)

## 2016-09-21 LAB — TSH: TSH: 0.92 u[IU]/mL (ref 0.35–4.50)

## 2016-09-27 ENCOUNTER — Encounter: Payer: Self-pay | Admitting: Family Medicine

## 2016-10-16 DIAGNOSIS — Z79891 Long term (current) use of opiate analgesic: Secondary | ICD-10-CM | POA: Diagnosis not present

## 2016-10-16 DIAGNOSIS — G894 Chronic pain syndrome: Secondary | ICD-10-CM | POA: Diagnosis not present

## 2016-10-16 DIAGNOSIS — M79606 Pain in leg, unspecified: Secondary | ICD-10-CM | POA: Diagnosis not present

## 2016-10-16 DIAGNOSIS — Z79899 Other long term (current) drug therapy: Secondary | ICD-10-CM | POA: Diagnosis not present

## 2016-10-20 ENCOUNTER — Ambulatory Visit (INDEPENDENT_AMBULATORY_CARE_PROVIDER_SITE_OTHER): Payer: BLUE CROSS/BLUE SHIELD | Admitting: Family Medicine

## 2016-10-20 ENCOUNTER — Encounter: Payer: Self-pay | Admitting: Family Medicine

## 2016-10-20 VITALS — BP 118/82 | HR 92 | Temp 98.2°F | Ht 70.0 in | Wt 189.6 lb

## 2016-10-20 DIAGNOSIS — Z0000 Encounter for general adult medical examination without abnormal findings: Secondary | ICD-10-CM

## 2016-10-20 NOTE — Patient Instructions (Addendum)
AMediterranean Diet A Mediterranean diet refers to food and lifestyle choices that are based on the traditions of countries located on the [employer]. This way of eating has been shown to help prevent certain conditions and improve outcomes for people who have chronic diseases, like kidney disease and heart disease. What are tips for following this plan? Lifestyle  Cook and eat meals together with your family, when possible.  Drink enough fluid to keep your urine clear or pale yellow.  Be physically active every day. This includes:  Aerobic exercise like running or swimming.  Leisure activities like gardening, walking, or housework.  Get 7-8 hours of sleep each night.  If recommended by your health care provider, drink red wine in moderation. This means 1 glass a day for nonpregnant women and 2 glasses a day for men. A glass of wine equals 5 oz (150 mL). Reading food labels  Check the serving size of packaged foods. For foods such as rice and pasta, the serving size refers to the amount of cooked product, not dry.  Check the total fat in packaged foods. Avoid foods that have saturated fat or trans fats.  Check the ingredients list for added sugars, such as corn syrup. Shopping  At the grocery store, buy most of your food from the areas near the walls of the store. This includes:  Fresh fruits and vegetables (produce).  Grains, beans, nuts, and seeds. Some of these may be available in unpackaged forms or large amounts (in bulk).  Fresh seafood.  Poultry and eggs.  Low-fat dairy products.  Buy whole ingredients instead of prepackaged foods.  Buy fresh fruits and vegetables in-season from local farmers markets.  Buy frozen fruits and vegetables in resealable bags.  If you do not have access to quality fresh seafood, buy precooked frozen shrimp or canned fish, such as tuna, salmon, or sardines.  Buy small amounts of raw or cooked vegetables, salads, or olives from  the deli or salad bar at your store.  Stock your pantry so you always have certain foods on hand, such as olive oil, canned tuna, canned tomatoes, rice, pasta, and beans. Cooking  Cook foods with extra-virgin olive oil instead of using butter or other vegetable oils.  Have meat as a side dish, and have vegetables or grains as your main dish. This means having meat in small portions or adding small amounts of meat to foods like pasta or stew.  Use beans or vegetables instead of meat in common dishes like chili or lasagna.  Experiment with different cooking methods. Try roasting or broiling vegetables instead of steaming or sauteing them.  Add frozen vegetables to soups, stews, pasta, or rice.  Add nuts or seeds for added healthy fat at each meal. You can add these to yogurt, salads, or vegetable dishes.  Marinate fish or vegetables using olive oil, lemon juice, garlic, and fresh herbs. Meal planning  Plan to eat 1 vegetarian meal one day each week. Try to work up to 2 vegetarian meals, if possible.  Eat seafood 2 or more times a week.  Have healthy snacks readily available, such as:  Vegetable sticks with hummus.  Greek yogurt.  Fruit and nut trail mix.  Eat balanced meals throughout the week. This includes:  Fruit: 2-3 servings a day  Vegetables: 4-5 servings a day  Low-fat dairy: 2 servings a day  Fish, poultry, or lean meat: 1 serving a day  Beans and legumes: 2 or more servings a week  Nuts  and seeds: 1-2 servings a day  Whole grains: 6-8 servings a day  Extra-virgin olive oil: 3-4 servings a day  Limit red meat and sweets to only a few servings a month What are my food choices?  Mediterranean diet  Recommended  Grains: Whole-grain pasta. Brown rice. Bulgar wheat. Polenta. Couscous. Whole-wheat bread. [place].  Vegetables: Artichokes. Beets. Broccoli. Cabbage. Carrots. Eggplant. Green beans. Chard. Kale. Spinach. Onions. Leeks. Peas. Squash.  Tomatoes. Peppers. Radishes.  Fruits: Apples. Apricots. Avocado. Berries. Bananas. Cherries. Dates. Figs. Grapes. Lemons. Melon. Oranges. Peaches. Plums. Pomegranate.  Meats and other protein foods: Beans. Almonds. Sunflower seeds. Pine nuts. Peanuts. [place]. Salmon. Scallops. Shrimp. [place]. Tilapia. Clams. Oysters. Eggs.  Dairy: Low-fat milk. Cheese. Greek yogurt.  Beverages: Water. Red wine. Herbal tea.  Fats and oils: Extra virgin olive oil. Avocado oil. Grape seed oil.  Sweets and desserts: [place] yogurt with honey. Baked apples. Poached pears. Trail mix.  Seasoning and other foods: Basil. Cilantro. Coriander. Cumin. Mint. Parsley. Sage. Rosemary. Tarragon. Garlic. Oregano. Thyme. Pepper. Balsalmic vinegar. Tahini. Hummus. Tomato sauce. Olives. Mushrooms.  Limit these  Grains: Prepackaged pasta or rice dishes. Prepackaged cereal with added sugar.  Vegetables: Deep fried potatoes (french fries).  Fruits: Fruit canned in syrup.  Meats and other protein foods: Beef. Pork. Lamb. Poultry with skin. Hot dogs. Berniece Salines.  Dairy: Ice cream. Sour cream. Whole milk.  Beverages: Juice. Sugar-sweetened soft drinks. Beer. Liquor and spirits.  Fats and oils: Butter. Canola oil. Vegetable oil. Beef fat (tallow). Lard.  Sweets and desserts: Cookies. Cakes. Pies. Candy.  Seasoning and other foods: Mayonnaise. Premade sauces and marinades.  The items listed may not be a complete list. Talk with your dietitian about what dietary choices are right for you. Summary  The Mediterranean diet includes both food and lifestyle choices.  Eat a variety of fresh fruits and vegetables, beans, nuts, seeds, and whole grains.  Limit the amount of red meat and sweets that you eat.  Talk with your health care provider about whether it is safe for you to drink red wine in moderation. This means 1 glass a day for nonpregnant women and 2 glasses a day for men. A glass of wine equals 5 oz (150 mL). This information  is not intended to replace advice given to you by your health care provider. Make sure you discuss any questions you have with your health care provider. Document Released: 04/20/2016 Document Revised: 05/23/2016 Document Reviewed: 04/20/2016 Elsevier Interactive Patient Education  2017 Elsevier Inc. Fat and Cholesterol Restricted Diet High levels of fat and cholesterol in your blood may lead to various health problems, such as diseases of the heart, blood vessels, gallbladder, liver, and pancreas. Fats are concentrated sources of energy that come in various forms. Certain types of fat, including saturated fat, may be harmful in excess. Cholesterol is a substance needed by your body in small amounts. Your body makes all the cholesterol it needs. Excess cholesterol comes from the food you eat. When you have high levels of cholesterol and saturated fat in your blood, health problems can develop because the excess fat and cholesterol will gather along the walls of your blood vessels, causing them to narrow. Choosing the right foods will help you control your intake of fat and cholesterol. This will help keep the levels of these substances in your blood within normal limits and reduce your risk of disease. What is my plan? Your health care provider recommends that you:  Limit your fat intake to ______%  or less of your total calories per day.  Limit the amount of cholesterol in your diet to less than _________mg per day.  Eat 20-30 grams of fiber each day. What types of fat should I choose?  Choose healthy fats more often. Choose monounsaturated and polyunsaturated fats, such as olive and canola oil, flaxseeds, walnuts, almonds, and seeds.  Eat more omega-3 fats. Good choices include salmon, mackerel, sardines, tuna, flaxseed oil, and ground flaxseeds. Aim to eat fish at least two times a week.  Limit saturated fats. Saturated fats are primarily found in animal products, such as meats, butter, and  cream. Plant sources of saturated fats include palm oil, palm kernel oil, and coconut oil.  Avoid foods with partially hydrogenated oils in them. These contain trans fats. Examples of foods that contain trans fats are stick margarine, some tub margarines, cookies, crackers, and other baked goods. What general guidelines do I need to follow? These guidelines for healthy eating will help you control your intake of fat and cholesterol:  Check food labels carefully to identify foods with trans fats or high amounts of saturated fat.  Fill one half of your plate with vegetables and green salads.  Fill one fourth of your plate with whole grains. Look for the word "whole" as the first word in the ingredient list.  Fill one fourth of your plate with lean protein foods.  Limit fruit to two servings a day. Choose fruit instead of juice.  Eat more foods that contain fiber, such as apples, broccoli, carrots, beans, peas, and barley.  Eat more home-cooked food and less restaurant, buffet, and fast food.  Limit or avoid alcohol.  Limit foods high in starch and sugar.  Limit fried foods.  Cook foods using methods other than frying. Baking, boiling, grilling, and broiling are all great options.  Lose weight if you are overweight. Losing just 5-10% of your initial body weight can help your overall health and prevent diseases such as diabetes and heart disease. What foods can I eat? Grains  Whole grains, such as whole wheat or whole grain breads, crackers, cereals, and pasta. Unsweetened oatmeal, bulgur, barley, quinoa, or brown rice. Corn or whole wheat flour tortillas. Vegetables  Fresh or frozen vegetables (raw, steamed, roasted, or grilled). Green salads. Fruits  All fresh, canned (in natural juice), or frozen fruits. Meats and other protein foods  Ground beef (85% or leaner), grass-fed beef, or beef trimmed of fat. Skinless chicken or [place]. Ground chicken or [place]. Pork trimmed of fat.  All fish and seafood. Eggs. Dried beans, peas, or lentils. Unsalted nuts or seeds. Unsalted canned or dry beans. Dairy  Low-fat dairy products, such as skim or 1% milk, 2% or reduced-fat cheeses, low-fat ricotta or cottage cheese, or plain low-fat yo Fats and oils  Tub margarines without trans fats. Light or reduced-fat mayonnaise and salad dressings. Avocado. Olive, canola, sesame, or safflower oils. Natural peanut or almond butter (choose ones without added sugar and oil). The items listed above may not be a complete list of recommended foods or beverages. Contact your dietitian for more options.  Foods to avoid Grains  White bread. White pasta. White rice. Cornbread. Bagels, pastries, and croissants. Crackers that contain trans fat. Vegetables  White potatoes. Corn. Creamed or fried vegetables. Vegetables in a cheese sauce. Fruits  Dried fruits. Canned fruit in light or heavy syrup. Fruit juice. Meats and other protein foods  Fatty cuts of meat. Ribs, chicken wings, bacon, sausage, bologna, salami, chitterlings, fatback, hot  dogs, bratwurst, and packaged luncheon meats. Liver and organ meats. Dairy  Whole or 2% milk, cream, half-and-half, and cream cheese. Whole milk cheeses. Whole-fat or sweetened yogurt. Full-fat cheeses. Nondairy creamers and whipped toppings. Processed cheese, cheese spreads, or cheese curds. Beverages  Alcohol. Sweetened drinks (such as sodas, lemonade, and fruit drinks or punches). Fats and oils  Butter, stick margarine, lard, shortening, ghee, or bacon fat. Coconut, palm kernel, or palm oils. Sweets and desserts  Corn syrup, sugars, honey, and molasses. Candy. Jam and jelly. Syrup. Sweetened cereals. Cookies, pies, cakes, donuts, muffins, and ice cream. The items listed above may not be a complete list of foods and beverages to avoid. Contact your dietitian for more information.  This information is not intended to replace advice given to you by your  health care provider. Make sure you discuss any questions you have with your health care provider. Document Released: 08/28/2005 Document Revised: 09/18/2014 Document Reviewed: 11/26/2013 Elsevier Interactive Patient Education  2017 [employer].

## 2016-10-20 NOTE — Progress Notes (Signed)
Subjective:     Patient ID: Luke Mcgrath, male   DOB: [birth year], 31 y.o.   MRN: [id number]  HPI Patient's here for physical exam. He has history of arteriovenous fistula of spinal cord vessels with prior surgery at [facility] several years ago. He's recovered and done well since then. He takes no regular medications. Occasional omeprazole as needed for heartburn symptoms. Nonsmoker. He is married and has 3 children. His wife is a PA in neurosurgery.  No consistent exercise. He works for an [employer]. Tetanus up-to-date. He declines flu vaccine this year.  Past Medical History:  Diagnosis Date  . Anxiety   . Cavernous malformation 09/2013   Cervical region   Past Surgical History:  Procedure Laterality Date  . BRAIN SURGERY  1990   age 20  . CERVICAL LAMINECTOMY FOR EXCISION OF NEOPLASM  2015   Cervical cavernoma at C6-7  . RECONSTRUCTION OF NOSE  1994    reports that he has never smoked. He has never used smokeless tobacco. He reports that he drinks alcohol. He reports that he does not use drugs. family history includes Cancer in his maternal grandmother; Diabetes in his father; Healthy in his mother and sister; Hyperlipidemia in his father and maternal grandmother; Hypertension in his father; Other in his brother. No Known Allergies   Review of Systems  Constitutional: Negative for activity change, appetite change, fatigue and fever.  HENT: Negative for congestion, ear pain and trouble swallowing.   Eyes: Negative for pain and visual disturbance.  Respiratory: Negative for cough, shortness of breath and wheezing.   Cardiovascular: Negative for chest pain and palpitations.  Gastrointestinal: Negative for abdominal distention, abdominal pain, blood in stool, constipation, diarrhea, nausea, rectal pain and vomiting.  Genitourinary: Negative for dysuria, hematuria and testicular pain.  Musculoskeletal: Negative for arthralgias and joint swelling.  Skin: Negative for rash.   Neurological: Negative for dizziness, syncope and headaches.  Hematological: Negative for adenopathy.  Psychiatric/Behavioral: Negative for confusion and dysphoric mood.       Objective:   Physical Exam  Constitutional: He is oriented to person, place, and time. He appears well-developed and well-nourished. No distress.  HENT:  Head: Normocephalic and atraumatic.  Right Ear: External ear normal.  Left Ear: External ear normal.  Mouth/Throat: Oropharynx is clear and moist.  Eyes: Conjunctivae and EOM are normal. Pupils are equal, round, and reactive to light.  Neck: Normal range of motion. Neck supple. No thyromegaly present.  Cardiovascular: Normal rate, regular rhythm and normal heart sounds.   No murmur heard. Pulmonary/Chest: No respiratory distress. He has no wheezes. He has no rales.  Abdominal: Soft. Bowel sounds are normal. He exhibits no distension and no mass. There is no tenderness. There is no rebound and no guarding.  Musculoskeletal: He exhibits no edema.  Lymphadenopathy:    He has no cervical adenopathy.  Neurological: He is alert and oriented to person, place, and time. He displays normal reflexes. No cranial nerve deficit.  Skin: No rash noted.  Psychiatric: He has a normal mood and affect.       Assessment:     Physical exam. Labs reviewed. Mildly elevated lipids. His glucose was 102 but he states he had a couple drinks of [facility] the morning of his lab.  No other concerns other than mild cholesterol elevation.    Plan:     -try to establish more consistent exercise-though his time is very limited -offered flu vaccine and he declines  Eulas Post MD  [facility]

## 2016-10-20 NOTE — Progress Notes (Signed)
Pre visit review using our clinic review tool, if applicable. No additional management support is needed unless otherwise documented below in the visit note. 

## 2016-11-07 ENCOUNTER — Encounter: Payer: Self-pay | Admitting: Neurology

## 2016-11-07 ENCOUNTER — Ambulatory Visit (INDEPENDENT_AMBULATORY_CARE_PROVIDER_SITE_OTHER): Payer: BLUE CROSS/BLUE SHIELD | Admitting: Neurology

## 2016-11-07 VITALS — BP 120/80 | HR 84 | Ht 70.0 in | Wt 186.1 lb

## 2016-11-07 DIAGNOSIS — G959 Disease of spinal cord, unspecified: Secondary | ICD-10-CM | POA: Diagnosis not present

## 2016-11-07 DIAGNOSIS — Q288 Other specified congenital malformations of circulatory system: Secondary | ICD-10-CM

## 2016-11-07 DIAGNOSIS — M792 Neuralgia and neuritis, unspecified: Secondary | ICD-10-CM | POA: Diagnosis not present

## 2016-11-07 DIAGNOSIS — Q283 Other malformations of cerebral vessels: Secondary | ICD-10-CM | POA: Diagnosis not present
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
## 2016-11-07 NOTE — Progress Notes (Signed)
Note faxed.

## 2016-11-07 NOTE — Patient Instructions (Signed)
Great to see you today!  MRI cervical spine with and without contrast  We will call you with the results of your testing  You can discuss seeking a second opinion about pain management options at one of the tertiary care centers

## 2016-11-09 DIAGNOSIS — Z79899 Other long term (current) drug therapy: Secondary | ICD-10-CM | POA: Diagnosis not present

## 2016-11-09 DIAGNOSIS — G894 Chronic pain syndrome: Secondary | ICD-10-CM | POA: Diagnosis not present

## 2016-11-09 DIAGNOSIS — Z79891 Long term (current) use of opiate analgesic: Secondary | ICD-10-CM | POA: Diagnosis not present

## 2016-11-09 DIAGNOSIS — M79606 Pain in leg, unspecified: Secondary | ICD-10-CM | POA: Diagnosis not present

## 2016-11-30 ENCOUNTER — Ambulatory Visit
Admission: RE | Admit: 2016-11-30 | Discharge: 2016-11-30 | Disposition: A | Discharge status: Home / Self Care | Payer: BLUE CROSS/BLUE SHIELD | Source: Ambulatory Visit | Attending: Neurology | Admitting: Neurology

## 2016-11-30 DIAGNOSIS — Q288 Other specified congenital malformations of circulatory system: Secondary | ICD-10-CM

## 2016-11-30 DIAGNOSIS — S14159A Other incomplete lesion at unspecified level of cervical spinal cord, initial encounter: Secondary | ICD-10-CM | POA: Diagnosis not present

## 2016-11-30 DIAGNOSIS — M792 Neuralgia and neuritis, unspecified: Secondary | ICD-10-CM

## 2016-11-30 DIAGNOSIS — G959 Disease of spinal cord, unspecified: Secondary | ICD-10-CM

## 2016-11-30 DIAGNOSIS — Q283 Other malformations of cerebral vessels: Secondary | ICD-10-CM

## 2016-11-30 MED ORDER — GADOBENATE DIMEGLUMINE 529 MG/ML IV SOLN
16.0000 mL | Freq: Once | INTRAVENOUS | Status: AC | PRN
  Administered 2016-11-30: 16 mL via INTRAVENOUS

## 2016-12-01 ENCOUNTER — Telehealth: Payer: Self-pay | Admitting: Neurology

## 2016-12-01 NOTE — Telephone Encounter (Signed)
Called and informed patient that his MRI cervical spine continues to show cavernoma at C7, which is relatively unchanged from his previous study in 2014.  He had surgery at [facility] for this in January 2015 and this is first image of this area since post-operatively. It is unusual that despite having surgery, there is no radiographic change in the size of this lesion.  Certainly, the location of the cavernoma would make complete excision very difficult and it is possible this is the best surgical outcome.  I would like him to be re-evaluated at [facility] with his new images.    Ammara Raj K. Posey Pronto, DO

## 2016-12-07 ENCOUNTER — Telehealth: Payer: Self-pay | Admitting: *Deleted

## 2016-12-07 NOTE — Telephone Encounter (Signed)
Left message for patient to call me.  [facility] appointment is on April 17 at 10:00.

## 2016-12-11 DIAGNOSIS — Z79891 Long term (current) use of opiate analgesic: Secondary | ICD-10-CM | POA: Diagnosis not present

## 2016-12-11 DIAGNOSIS — M79606 Pain in leg, unspecified: Secondary | ICD-10-CM | POA: Diagnosis not present

## 2016-12-11 DIAGNOSIS — G894 Chronic pain syndrome: Secondary | ICD-10-CM | POA: Diagnosis not present

## 2016-12-11 DIAGNOSIS — Z79899 Other long term (current) drug therapy: Secondary | ICD-10-CM | POA: Diagnosis not present

## 2016-12-11 DIAGNOSIS — M7021 Olecranon bursitis, right elbow: Secondary | ICD-10-CM | POA: Diagnosis not present

## 2017-01-08 DIAGNOSIS — Z79891 Long term (current) use of opiate analgesic: Secondary | ICD-10-CM | POA: Diagnosis not present

## 2017-01-08 DIAGNOSIS — M79606 Pain in leg, unspecified: Secondary | ICD-10-CM | POA: Diagnosis not present

## 2017-01-08 DIAGNOSIS — G894 Chronic pain syndrome: Secondary | ICD-10-CM | POA: Diagnosis not present

## 2017-01-08 DIAGNOSIS — Z79899 Other long term (current) drug therapy: Secondary | ICD-10-CM | POA: Diagnosis not present

## 2017-01-23 DIAGNOSIS — G894 Chronic pain syndrome: Secondary | ICD-10-CM | POA: Diagnosis not present

## 2017-01-23 DIAGNOSIS — M79606 Pain in leg, unspecified: Secondary | ICD-10-CM | POA: Diagnosis not present

## 2017-01-23 DIAGNOSIS — Z79899 Other long term (current) drug therapy: Secondary | ICD-10-CM | POA: Diagnosis not present

## 2017-01-23 DIAGNOSIS — Z79891 Long term (current) use of opiate analgesic: Secondary | ICD-10-CM | POA: Diagnosis not present

## 2017-02-06 DIAGNOSIS — Z79899 Other long term (current) drug therapy: Secondary | ICD-10-CM | POA: Diagnosis not present

## 2017-02-06 DIAGNOSIS — G894 Chronic pain syndrome: Secondary | ICD-10-CM | POA: Diagnosis not present

## 2017-02-06 DIAGNOSIS — M79606 Pain in leg, unspecified: Secondary | ICD-10-CM | POA: Diagnosis not present

## 2017-02-06 DIAGNOSIS — Z79891 Long term (current) use of opiate analgesic: Secondary | ICD-10-CM | POA: Diagnosis not present

## 2017-03-06 DIAGNOSIS — Z79899 Other long term (current) drug therapy: Secondary | ICD-10-CM | POA: Diagnosis not present

## 2017-03-06 DIAGNOSIS — G894 Chronic pain syndrome: Secondary | ICD-10-CM | POA: Diagnosis not present

## 2017-03-06 DIAGNOSIS — Z79891 Long term (current) use of opiate analgesic: Secondary | ICD-10-CM | POA: Diagnosis not present

## 2017-03-06 DIAGNOSIS — M79606 Pain in leg, unspecified: Secondary | ICD-10-CM | POA: Diagnosis not present

## 2017-03-20 DIAGNOSIS — M792 Neuralgia and neuritis, unspecified: Secondary | ICD-10-CM | POA: Diagnosis not present

## 2017-03-20 DIAGNOSIS — F4542 Pain disorder with related psychological factors: Secondary | ICD-10-CM | POA: Diagnosis not present

## 2017-03-20 DIAGNOSIS — G609 Hereditary and idiopathic neuropathy, unspecified: Secondary | ICD-10-CM | POA: Diagnosis not present

## 2017-03-20 DIAGNOSIS — G894 Chronic pain syndrome: Secondary | ICD-10-CM | POA: Diagnosis not present

## 2017-03-30 DIAGNOSIS — Z113 Encounter for screening for infections with a predominantly sexual mode of transmission: Secondary | ICD-10-CM | POA: Diagnosis not present

## 2017-03-30 DIAGNOSIS — Z114 Encounter for screening for human immunodeficiency virus [HIV]: Secondary | ICD-10-CM | POA: Diagnosis not present

## 2017-04-03 DIAGNOSIS — Z79899 Other long term (current) drug therapy: Secondary | ICD-10-CM | POA: Diagnosis not present

## 2017-04-03 DIAGNOSIS — G894 Chronic pain syndrome: Secondary | ICD-10-CM | POA: Diagnosis not present

## 2017-04-03 DIAGNOSIS — Z79891 Long term (current) use of opiate analgesic: Secondary | ICD-10-CM | POA: Diagnosis not present

## 2017-04-03 DIAGNOSIS — M79606 Pain in leg, unspecified: Secondary | ICD-10-CM | POA: Diagnosis not present

## 2017-05-01 DIAGNOSIS — M79606 Pain in leg, unspecified: Secondary | ICD-10-CM | POA: Diagnosis not present

## 2017-05-01 DIAGNOSIS — Z79891 Long term (current) use of opiate analgesic: Secondary | ICD-10-CM | POA: Diagnosis not present

## 2017-05-01 DIAGNOSIS — Z79899 Other long term (current) drug therapy: Secondary | ICD-10-CM | POA: Diagnosis not present

## 2017-05-01 DIAGNOSIS — G894 Chronic pain syndrome: Secondary | ICD-10-CM | POA: Diagnosis not present

## 2017-05-29 DIAGNOSIS — Z79899 Other long term (current) drug therapy: Secondary | ICD-10-CM | POA: Diagnosis not present

## 2017-05-29 DIAGNOSIS — M79606 Pain in leg, unspecified: Secondary | ICD-10-CM | POA: Diagnosis not present

## 2017-05-29 DIAGNOSIS — Z79891 Long term (current) use of opiate analgesic: Secondary | ICD-10-CM | POA: Diagnosis not present

## 2017-05-29 DIAGNOSIS — G894 Chronic pain syndrome: Secondary | ICD-10-CM | POA: Diagnosis not present

## 2017-06-18 ENCOUNTER — Encounter: Payer: Self-pay | Admitting: Family Medicine

## 2017-06-18 ENCOUNTER — Ambulatory Visit (INDEPENDENT_AMBULATORY_CARE_PROVIDER_SITE_OTHER): Payer: BLUE CROSS/BLUE SHIELD | Admitting: Family Medicine

## 2017-06-18 VITALS — BP 134/76 | HR 78 | Temp 98.6°F | Resp 16 | Wt 180.0 lb

## 2017-06-18 DIAGNOSIS — L281 Prurigo nodularis: Secondary | ICD-10-CM | POA: Diagnosis not present

## 2017-06-18 DIAGNOSIS — J029 Acute pharyngitis, unspecified: Secondary | ICD-10-CM

## 2017-06-18 DIAGNOSIS — K120 Recurrent oral aphthae: Secondary | ICD-10-CM

## 2017-06-18 DIAGNOSIS — F439 Reaction to severe stress, unspecified: Secondary | ICD-10-CM

## 2017-06-18 DIAGNOSIS — Z113 Encounter for screening for infections with a predominantly sexual mode of transmission: Secondary | ICD-10-CM | POA: Diagnosis not present

## 2017-06-18 LAB — POCT RAPID STREP A (OFFICE): RAPID STREP A SCREEN: NEGATIVE

## 2017-06-18 MED ORDER — NYSTATIN 100000 UNIT/ML MT SUSP: 10.0000 mL | Freq: Four times a day (QID) | OROMUCOSAL | 0 refills | Status: DC

## 2017-06-18 NOTE — Progress Notes (Signed)
Subjective:     Patient ID: Luke Mcgrath, male   DOB: 25-Oct-1985, 31 y.o.   MRN: [id number]  HPI Patient seen for the following issues  4 day history of sore throat. He also has had sore area left side of tongue just for the past few days. He also noted a whitish coating on his tongue and was concerned he may have thrush. He has not had any recent prednisone use nor any antibiotics. No known history of being immunocompromised. No history of diabetes. Denies any fevers or chills.  Patient has been under increased stress recently with separation from his wife. They're trying to get back together. Has not had any counseling yet. He is requesting STD testing. He had recent sexual encounter but no known exposure to STD. He has not had any dysuria or deep penile rash or penile discharge. No prior history of STD  Patient states he has long-standing history of anxiety. He has also like prurigo nodularis and as an anxiety response frequently picks at his skin and has several aggravated places now. He thinks this is been exacerbated by stress. He is followed by pain management and is on Cymbalta as well as oxycodone. He has chronic neuralgia left thigh  Past Medical History:  Diagnosis Date  . Anxiety   . Cavernous malformation 09/2013   Cervical region   Past Surgical History:  Procedure Laterality Date  . BRAIN SURGERY  1990   age 47  . CERVICAL LAMINECTOMY FOR EXCISION OF NEOPLASM  2015   Cervical cavernoma at C6-7  . RECONSTRUCTION OF NOSE  1994    reports that he has never smoked. He has never used smokeless tobacco. He reports that he drinks alcohol. He reports that he does not use drugs. family history includes Cancer in his maternal grandmother; Diabetes in his father; Healthy in his mother and sister; Hyperlipidemia in his father and maternal grandmother; Hypertension in his father; Other in his brother. No Known Allergies   Review of Systems  Constitutional: Negative for appetite  change, chills, fever and unexpected weight change.  HENT: Positive for sore throat.   Respiratory: Negative for shortness of breath.   Cardiovascular: Negative for chest pain.  Gastrointestinal: Negative for abdominal pain.  Skin: Positive for rash.  Neurological: Negative for weakness.  Hematological: Negative for adenopathy.  Psychiatric/Behavioral: Negative for dysphoric mood. The patient is nervous/anxious.        Objective:   Physical Exam  Constitutional: He appears well-developed and well-nourished.  HENT:  Right Ear: External ear normal.  Left Ear: External ear normal.  Patient has prominent whitish coating on this time but no other evidence for any thrush. He has aphthous type ulcer right soft palate area and also appears to have fairly large aphthous type ulcer which appears to be healing left side of tongue  Patient also has several small scabbed areas on his upper back forearms and lower extremities. He states he's had these intermittently for years and the seem to flareup with anxiety. He frequently picks at these areas       Assessment:     #1 sore throat. Question viral. Rapid strep negative. Evidence for aphthous type ulcers  #2 patient requesting STD screening. No prior history of STD  #3 prurigo nodularis    Plan:     -Recommend trial of nystatin oral suspension 10 mL's 4 times daily as needed -We recommended STD screen with HIV, RPR, and GC and chlamydia screening -Discussed prevention of STDs -We  discussed treatments for prurigo nodularis. Would normally look at possible SSRI medications such as sertraline but he is already on Cymbalta. -We strongly suggested he consider some counseling both for his marital issues and his personal anxiety issues and he will consider.  Eulas Post MD [facility] at [facility]

## 2017-06-19 LAB — RPR: RPR: NONREACTIVE

## 2017-06-19 LAB — HIV ANTIBODY (ROUTINE TESTING W REFLEX): HIV: NONREACTIVE

## 2017-06-20 LAB — GC/CHLAMYDIA PROBE AMP
Chlamydia trachomatis, NAA: NEGATIVE
Neisseria gonorrhoeae by PCR: NEGATIVE

## 2017-06-21 ENCOUNTER — Telehealth: Payer: Self-pay | Admitting: Family Medicine

## 2017-06-21 NOTE — Telephone Encounter (Signed)
I called the pt and informed him of the results.

## 2017-06-21 NOTE — Telephone Encounter (Signed)
Luke Mcgrath pt returned your call °

## 2017-07-20 DIAGNOSIS — G894 Chronic pain syndrome: Secondary | ICD-10-CM | POA: Diagnosis not present

## 2017-07-20 DIAGNOSIS — M79606 Pain in leg, unspecified: Secondary | ICD-10-CM | POA: Diagnosis not present

## 2017-07-20 DIAGNOSIS — Z79899 Other long term (current) drug therapy: Secondary | ICD-10-CM | POA: Diagnosis not present

## 2017-07-20 DIAGNOSIS — R202 Paresthesia of skin: Secondary | ICD-10-CM | POA: Diagnosis not present

## 2017-07-20 DIAGNOSIS — Z79891 Long term (current) use of opiate analgesic: Secondary | ICD-10-CM | POA: Diagnosis not present

## 2017-07-30 ENCOUNTER — Telehealth: Payer: Self-pay | Admitting: *Deleted

## 2017-07-30 ENCOUNTER — Telehealth: Payer: Self-pay | Admitting: Neurology

## 2017-07-30 NOTE — Telephone Encounter (Signed)
Patient called stating that he is having arm weakness and numbness and it has been getting worse.  Instructed him to go to [facility] or ER.  He said that he was going to go to [facility] to get checked out.

## 2017-08-09 ENCOUNTER — Ambulatory Visit: Payer: BLUE CROSS/BLUE SHIELD | Admitting: Neurology
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
## 2017-08-17 DIAGNOSIS — G894 Chronic pain syndrome: Secondary | ICD-10-CM | POA: Diagnosis not present

## 2017-08-17 DIAGNOSIS — M79606 Pain in leg, unspecified: Secondary | ICD-10-CM | POA: Diagnosis not present

## 2017-08-17 DIAGNOSIS — Q283 Other malformations of cerebral vessels: Secondary | ICD-10-CM | POA: Diagnosis not present

## 2017-08-17 DIAGNOSIS — Z79891 Long term (current) use of opiate analgesic: Secondary | ICD-10-CM | POA: Diagnosis not present

## 2017-08-17 DIAGNOSIS — Z79899 Other long term (current) drug therapy: Secondary | ICD-10-CM | POA: Diagnosis not present

## 2017-08-17 DIAGNOSIS — R202 Paresthesia of skin: Secondary | ICD-10-CM | POA: Diagnosis not present

## 2017-09-07 DIAGNOSIS — Q283 Other malformations of cerebral vessels: Secondary | ICD-10-CM | POA: Diagnosis not present

## 2017-09-07 DIAGNOSIS — G894 Chronic pain syndrome: Secondary | ICD-10-CM | POA: Diagnosis not present

## 2017-09-07 DIAGNOSIS — Z79891 Long term (current) use of opiate analgesic: Secondary | ICD-10-CM | POA: Diagnosis not present

## 2017-09-07 DIAGNOSIS — G890 Central pain syndrome: Secondary | ICD-10-CM | POA: Diagnosis not present

## 2017-09-07 DIAGNOSIS — Z79899 Other long term (current) drug therapy: Secondary | ICD-10-CM | POA: Diagnosis not present

## 2017-09-07 DIAGNOSIS — R202 Paresthesia of skin: Secondary | ICD-10-CM | POA: Diagnosis not present

## 2017-09-07 DIAGNOSIS — M79606 Pain in leg, unspecified: Secondary | ICD-10-CM | POA: Diagnosis not present

## 2017-09-19 NOTE — Telephone Encounter (Signed)
error 

## 2017-09-25 ENCOUNTER — Telehealth: Payer: Self-pay | Admitting: Neurology

## 2017-09-25 NOTE — Telephone Encounter (Signed)
Patient needs to talk to someone about what is going on with patient

## 2017-09-26 NOTE — Telephone Encounter (Signed)
Called patient and left message for him to call me back.  

## 2017-10-05 DIAGNOSIS — M79606 Pain in leg, unspecified: Secondary | ICD-10-CM | POA: Diagnosis not present

## 2017-10-05 DIAGNOSIS — Z79899 Other long term (current) drug therapy: Secondary | ICD-10-CM | POA: Diagnosis not present

## 2017-10-05 DIAGNOSIS — G894 Chronic pain syndrome: Secondary | ICD-10-CM | POA: Diagnosis not present

## 2017-10-05 DIAGNOSIS — Q283 Other malformations of cerebral vessels: Secondary | ICD-10-CM | POA: Diagnosis not present

## 2017-10-05 DIAGNOSIS — R202 Paresthesia of skin: Secondary | ICD-10-CM | POA: Diagnosis not present

## 2017-10-05 DIAGNOSIS — Z79891 Long term (current) use of opiate analgesic: Secondary | ICD-10-CM | POA: Diagnosis not present

## 2017-10-15 ENCOUNTER — Ambulatory Visit (INDEPENDENT_AMBULATORY_CARE_PROVIDER_SITE_OTHER): Payer: BLUE CROSS/BLUE SHIELD | Admitting: Neurology

## 2017-10-15 ENCOUNTER — Encounter: Payer: Self-pay | Admitting: Neurology

## 2017-10-15 VITALS — BP 130/80 | HR 102 | Ht 70.0 in | Wt 177.1 lb

## 2017-10-15 DIAGNOSIS — M792 Neuralgia and neuritis, unspecified: Secondary | ICD-10-CM

## 2017-10-15 DIAGNOSIS — M79602 Pain in left arm: Secondary | ICD-10-CM

## 2017-10-15 DIAGNOSIS — R202 Paresthesia of skin: Secondary | ICD-10-CM

## 2017-10-15 DIAGNOSIS — G959 Disease of spinal cord, unspecified: Secondary | ICD-10-CM

## 2017-10-15 DIAGNOSIS — Q288 Other specified congenital malformations of circulatory system: Secondary | ICD-10-CM | POA: Diagnosis not present

## 2017-10-15 DIAGNOSIS — M79601 Pain in right arm: Secondary | ICD-10-CM

## 2017-10-15 MED ORDER — DULOXETINE HCL 60 MG PO CPEP: 60.0000 mg | ORAL_CAPSULE | Freq: Every day | ORAL | 5 refills | Status: DC

## 2017-10-15 MED ORDER — CYCLOBENZAPRINE HCL 5 MG PO TABS: 5.0000 mg | ORAL_TABLET | Freq: Every day | ORAL | 5 refills | Status: DC
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
## 2017-10-15 NOTE — Patient Instructions (Addendum)
NCS/EMG of the arms  MRI cervical spine wwo contrast  Start flexeril 5mg  at bedtime   We will refer you to Pain Management and to see Dr. Guss Bunde at [facility]

## 2017-10-16 ENCOUNTER — Telehealth: Payer: Self-pay | Admitting: Neurology

## 2017-10-16 NOTE — Telephone Encounter (Signed)
I spoke with patient's mom and informed her that I did send referral yesterday at 2:29.  I called Leah (ext. [phone]) and left her a message to please look for referral so that when mom calls back she can schedule an appointment.

## 2017-10-16 NOTE — Telephone Encounter (Signed)
Patient mother would like [place] to fax the referral to Dr Rita Ohara. They will not make appt until they get the fax and according to there office they have not received one from our office. Would like a call back to let her know when it is done so she can call and make appt

## 2017-11-01 ENCOUNTER — Encounter: Payer: BLUE CROSS/BLUE SHIELD | Admitting: Neurology

## 2017-11-23 ENCOUNTER — Other Ambulatory Visit: Payer: BLUE CROSS/BLUE SHIELD

## 2017-11-30 ENCOUNTER — Ambulatory Visit
Admission: RE | Admit: 2017-11-30 | Discharge: 2017-11-30 | Disposition: A | Discharge status: Home / Self Care | Payer: BLUE CROSS/BLUE SHIELD | Source: Ambulatory Visit | Attending: Neurology | Admitting: Neurology

## 2017-11-30 ENCOUNTER — Telehealth: Payer: Self-pay | Admitting: *Deleted

## 2017-11-30 DIAGNOSIS — M47812 Spondylosis without myelopathy or radiculopathy, cervical region: Secondary | ICD-10-CM | POA: Diagnosis not present

## 2017-11-30 DIAGNOSIS — M79601 Pain in right arm: Secondary | ICD-10-CM

## 2017-11-30 DIAGNOSIS — R202 Paresthesia of skin: Secondary | ICD-10-CM

## 2017-11-30 DIAGNOSIS — M79602 Pain in left arm: Secondary | ICD-10-CM

## 2017-11-30 DIAGNOSIS — G959 Disease of spinal cord, unspecified: Secondary | ICD-10-CM

## 2017-11-30 DIAGNOSIS — Q288 Other specified congenital malformations of circulatory system: Secondary | ICD-10-CM

## 2017-11-30 DIAGNOSIS — M792 Neuralgia and neuritis, unspecified: Secondary | ICD-10-CM

## 2017-11-30 MED ORDER — GADOBENATE DIMEGLUMINE 529 MG/ML IV SOLN
15.0000 mL | Freq: Once | INTRAVENOUS | Status: AC | PRN
  Administered 2017-11-30: 15 mL via INTRAVENOUS

## 2017-11-30 NOTE — Telephone Encounter (Signed)
Patient given results and instructions.   

## 2017-11-30 NOTE — Telephone Encounter (Signed)
Patient given results and instructions.  Rescheduled his [place] appointment for 01-03-18 at 2:30.  I will inform patient.

## 2017-11-30 NOTE — Telephone Encounter (Signed)
-----   Message from Alda Berthold, DO sent at 11/30/2017 12:28 PM EDT ----- Please inform patient that his MRI does not show any new changes or disc bulge; the cavernoma of the spinal cord is stable when compared to last year.  Recommend NCS/EMG of the arms and f/u with his neurosurgeon at [facility] for ongoing symptoms.

## 2017-11-30 NOTE — Telephone Encounter (Signed)
-----   Message from Alda Berthold, DO sent at 11/30/2017 12:28 PM EDT ----- Please inform patient that his MRI does not show any new changes or disc bulge; the cavernoma of the spinal cord is stable when compared to last year.  Recommend NCS/EMG of the arms and f/u with his neurosurgeon at [facility] for ongoing symptoms.

## 2017-12-05 NOTE — Telephone Encounter (Signed)
Luke Mcgrath asked me to call patient to get patient sch for a EMG of the arms. I have called and left several messages for the patient. We have been unable to contact the patient

## 2017-12-05 NOTE — Telephone Encounter (Signed)
I will send patient a letter requesting for him to call our office to schedule an appointment.

## 2017-12-06 ENCOUNTER — Telehealth: Payer: Self-pay | Admitting: Neurology

## 2017-12-06 ENCOUNTER — Encounter: Payer: Self-pay | Admitting: Neurology

## 2017-12-06 NOTE — Telephone Encounter (Signed)
Patient notified of EMG and [facility] appointments.  He will drop off paperwork for [facility] tomorrow.

## 2017-12-06 NOTE — Telephone Encounter (Signed)
Patient called and scheduled an EMG appt for 12/18/17. He is wanting to speak with you regarding needing FMLA paperwork filled out. He said his arm is worse and he is needing paperwork filled out for this Monday?. Please Call. Thanks

## 2017-12-13 ENCOUNTER — Telehealth: Payer: Self-pay | Admitting: Neurology

## 2017-12-13 NOTE — Telephone Encounter (Signed)
Patient called to check on the status of his FMLA paperwork. Please Call. Thanks

## 2017-12-18 ENCOUNTER — Ambulatory Visit (INDEPENDENT_AMBULATORY_CARE_PROVIDER_SITE_OTHER): Payer: BLUE CROSS/BLUE SHIELD | Admitting: Neurology

## 2017-12-18 DIAGNOSIS — Q288 Other specified congenital malformations of circulatory system: Secondary | ICD-10-CM

## 2017-12-18 DIAGNOSIS — G5603 Carpal tunnel syndrome, bilateral upper limbs: Secondary | ICD-10-CM

## 2017-12-18 DIAGNOSIS — M792 Neuralgia and neuritis, unspecified: Secondary | ICD-10-CM

## 2017-12-18 DIAGNOSIS — G959 Disease of spinal cord, unspecified: Secondary | ICD-10-CM

## 2017-12-18 NOTE — Procedures (Signed)
[facility]  [address], [address]  [place], [place] [address] Tel: [phone] Fax:  [phone] Test Date:  12/18/2017  Patient: Luke Mcgrath DOB: 04/10/1986 Physician: Narda Amber, DO  Sex: Male Height: 5\' 10"  Ref Phys: Narda Amber, DO  ID#: [id number] Temp: 36.1C Technician:    Patient Complaints:   NCV & EMG Findings: Extensive electrodiagnostic testing of the left upper extremity and additional studies of the right shows:  1. Left median sensory response is absent. Right median sensory response shows prolonged latency (4.2 ms) and reduced amplitude (6.7 V).  Right ulnar sensory response shows reduced amplitude. Left ulnar sensory response is within normal limits. 2. Bilateral median sensory responses show prolonged latencies (L5.0, R4.1 ms) and normal amplitude. Of note, there is a Martin-Gruber anastomosis on the left, normal variant. Right ulnar motor response shows conduction velocity slowing across the elbow (A Elbow-B Elbow, 43 m/s) with normal latency and amplitude. Left ulnar motor responses within normal limits. 3. There is no evidence of active or chronic motor axon loss changes affecting any of the tested muscles. Motor unit configuration is within normal limits.  Impression: 1. Bilateral median neuropathy at or distal to the wrist, consistent with clinical diagnosis of carpal tunnel syndrome. Overall, these findings are severe in degree electrically and worse on the left. 2. Right ulnar neuropathy with slowing across the elbow, mild in degree electrically. 3. Incidentally, there is a left Martin-Gruber anastomosis, a normal variant.   ___________________________ Narda Amber, DO    Nerve Conduction Studies Anti Sensory Summary Table   Site NR Peak (ms) Norm Peak (ms) P-T Amp (V) Norm P-T Amp  Left Median Anti Sensory (2nd Digit)  36.1C  Wrist NR  <3.4  >20  Right Median Anti Sensory (2nd Digit)  36.1C  Wrist    4.2 <3.4 6.7 >20  Left  Ulnar Anti Sensory (5th Digit)  36.1C  Wrist    3.1 <3.1 13.4 >12  Right Ulnar Anti Sensory (5th Digit)  36.1C  Wrist    3.0 <3.1 9.2 >12   Motor Summary Table   Site NR Onset (ms) Norm Onset (ms) O-P Amp (mV) Norm O-P Amp Site1 Site2 Delta-0 (ms) Dist (cm) Vel (m/s) Norm Vel (m/s)  Left Median Motor (Abd Poll Brev)  36.1C  Wrist    5.0 <3.9 6.3 >6 Elbow Wrist 5.9 30.0 51 >50  Elbow    10.9  6.5  Ulnar-wrist crossover Elbow 6.9 0.0    Ulnar-wrist crossover    4.0  3.4         Right Median Motor (Abd Poll Brev)  36.1C  Wrist    4.1 <3.9 9.5 >6 Elbow Wrist 5.1 30.0 59 >50  Elbow    9.2  9.2         Left Ulnar Motor (Abd Dig Minimi)  36.1C  Wrist    2.6 <3.1 11.6 >7 B Elbow Wrist 3.7 24.0 65 >50  B Elbow    6.3  11.3  A Elbow B Elbow 1.7 10.0 59 >50  A Elbow    8.0  10.8         Right Ulnar Motor (Abd Dig Minimi)  36.1C  Wrist    2.0 <3.1 13.0 >7 B Elbow Wrist 4.3 25.0 58 >50  B Elbow    6.3  11.9  A Elbow B Elbow 2.3 10.0 43 >50  A Elbow    8.6  11.3  EMG   Side Muscle Ins Act Fibs Psw Fasc Number Recrt Dur Dur. Amp Amp. Poly Poly. Comment  Right 1stDorInt Nml Nml Nml Nml Nml Nml Nml Nml Nml Nml Nml Nml N/A  Right PronatorTeres Nml Nml Nml Nml Nml Nml Nml Nml Nml Nml Nml Nml N/A  Right Biceps Nml Nml Nml Nml Nml Nml Nml Nml Nml Nml Nml Nml N/A  Right Triceps Nml Nml Nml Nml Nml Nml Nml Nml Nml Nml Nml Nml N/A  Right Deltoid Nml Nml Nml Nml Nml Nml Nml Nml Nml Nml Nml Nml N/A  Left 1stDorInt Nml Nml Nml Nml Nml Nml Nml Nml Nml Nml Nml Nml N/A  Left Abd Poll Brev Nml Nml Nml Nml Nml Nml Nml Nml Nml Nml Nml Nml N/A  Left FlexPolLong Nml Nml Nml Nml Nml Nml Nml Nml Nml Nml Nml Nml N/A  Left PronatorTeres Nml Nml Nml Nml Nml Nml Nml Nml Nml Nml Nml Nml N/A  Left Biceps Nml Nml Nml Nml Nml Nml Nml Nml Nml Nml Nml Nml N/A  Left Triceps Nml Nml Nml Nml Nml Nml Nml Nml Nml Nml Nml Nml N/A  Left Deltoid Nml Nml Nml Nml Nml Nml Nml Nml Nml Nml Nml Nml N/A      Waveforms:                   [facility]  [address], [place]  [place], [place] [address] Tel: [phone] Fax:  [phone] Test Date:  12/18/2017  Patient: Luke Mcgrath DOB: [birth year] Physician: Narda Amber, DO  Sex: Male Height: 5\' 10"  Ref Phys: Narda Amber, DO  ID#: [id number] Temp: 36.1C Technician:    Patient Complaints:   NCV & EMG Findings: Extensive electrodiagnostic testing of the left upper extremity and additional studies of the right shows:  4. Left median sensory response is absent. Right median sensory response shows prolonged latency (4.2 ms) and reduced amplitude (6.7 V).  Right ulnar sensory response shows reduced amplitude. Left ulnar sensory response is within normal limits. 5. Bilateral median sensory responses show prolonged latencies (L5.0, R4.1 ms) and normal amplitude. Of note, there is a Martin-Gruber anastomosis on the left, normal variant. Right ulnar motor response shows conduction velocity slowing across the elbow (A Elbow-B Elbow, 43 m/s) with normal latency and amplitude. Left ulnar motor responses within normal limits. 6. There is no evidence of active or chronic motor axon loss changes affecting any of the tested muscles. Motor unit configuration is within normal limits.  Impression: 4. Bilateral median neuropathy at or distal to the wrist, consistent with clinical diagnosis of carpal tunnel syndrome. Overall, these findings are severe in degree electrically and worse on the left. 5. Right ulnar neuropathy with slowing across the elbow, mild in degree electrically. 6. Incidentally, there is a left Martin-Gruber anastomosis, a normal variant.   ___________________________ Narda Amber, DO    Nerve Conduction Studies Anti Sensory Summary Table   Site NR Peak (ms) Norm Peak (ms) P-T Amp (V) Norm P-T Amp  Left Median Anti Sensory (2nd Digit)  36.1C  Wrist NR  <3.4  >20  Right Median Anti Sensory (2nd Digit)  36.1C  Wrist    4.2  <3.4 6.7 >20  Left Ulnar Anti Sensory (5th Digit)  36.1C  Wrist    3.1 <3.1 13.4 >12  Right Ulnar Anti Sensory (5th Digit)  36.1C  Wrist    3.0 <3.1 9.2 >12   Motor Summary Table   Site NR  Onset (ms) Norm Onset (ms) O-P Amp (mV) Norm O-P Amp Site1 Site2 Delta-0 (ms) Dist (cm) Vel (m/s) Norm Vel (m/s)  Left Median Motor (Abd Poll Brev)  36.1C  Wrist    5.0 <3.9 6.3 >6 Elbow Wrist 5.9 30.0 51 >50  Elbow    10.9  6.5  Ulnar-wrist crossover Elbow 6.9 0.0    Ulnar-wrist crossover    4.0  3.4         Right Median Motor (Abd Poll Brev)  36.1C  Wrist    4.1 <3.9 9.5 >6 Elbow Wrist 5.1 30.0 59 >50  Elbow    9.2  9.2         Left Ulnar Motor (Abd Dig Minimi)  36.1C  Wrist    2.6 <3.1 11.6 >7 B Elbow Wrist 3.7 24.0 65 >50  B Elbow    6.3  11.3  A Elbow B Elbow 1.7 10.0 59 >50  A Elbow    8.0  10.8         Right Ulnar Motor (Abd Dig Minimi)  36.1C  Wrist    2.0 <3.1 13.0 >7 B Elbow Wrist 4.3 25.0 58 >50  B Elbow    6.3  11.9  A Elbow B Elbow 2.3 10.0 43 >50  A Elbow    8.6  11.3          EMG   Side Muscle Ins Act Fibs Psw Fasc Number Recrt Dur Dur. Amp Amp. Poly Poly. Comment  Right 1stDorInt Nml Nml Nml Nml Nml Nml Nml Nml Nml Nml Nml Nml N/A  Right PronatorTeres Nml Nml Nml Nml Nml Nml Nml Nml Nml Nml Nml Nml N/A  Right Biceps Nml Nml Nml Nml Nml Nml Nml Nml Nml Nml Nml Nml N/A  Right Triceps Nml Nml Nml Nml Nml Nml Nml Nml Nml Nml Nml Nml N/A  Right Deltoid Nml Nml Nml Nml Nml Nml Nml Nml Nml Nml Nml Nml N/A  Left 1stDorInt Nml Nml Nml Nml Nml Nml Nml Nml Nml Nml Nml Nml N/A  Left Abd Poll Brev Nml Nml Nml Nml Nml Nml Nml Nml Nml Nml Nml Nml N/A  Left FlexPolLong Nml Nml Nml Nml Nml Nml Nml Nml Nml Nml Nml Nml N/A  Left PronatorTeres Nml Nml Nml Nml Nml Nml Nml Nml Nml Nml Nml Nml N/A  Left Biceps Nml Nml Nml Nml Nml Nml Nml Nml Nml Nml Nml Nml N/A  Left Triceps Nml Nml Nml Nml Nml Nml Nml Nml Nml Nml Nml Nml N/A  Left Deltoid Nml Nml Nml Nml Nml Nml Nml Nml Nml Nml Nml Nml N/A       Waveforms:

## 2017-12-20 ENCOUNTER — Telehealth: Payer: Self-pay | Admitting: Neurology

## 2017-12-20 DIAGNOSIS — Q288 Other specified congenital malformations of circulatory system: Secondary | ICD-10-CM | POA: Diagnosis not present

## 2017-12-20 DIAGNOSIS — M542 Cervicalgia: Secondary | ICD-10-CM | POA: Diagnosis not present

## 2017-12-20 NOTE — Telephone Encounter (Signed)
Pt's mother left a voicemail message saying she has some questions on the FMLA paperwork that needs to be received by the 16th of April

## 2017-12-21 ENCOUNTER — Telehealth: Payer: Self-pay | Admitting: *Deleted

## 2017-12-21 NOTE — Telephone Encounter (Signed)
Spoke with Caryl Pina, she has already left a message for the Pt today, advising FMLA paperwork will be completed after ortho appt info can be added.

## 2017-12-21 NOTE — Telephone Encounter (Signed)
Called patient and left message giving him the results and instructions.  Referral sent to ortho.  Paperwork will be ready when I know his ortho appointment date.

## 2017-12-21 NOTE — Telephone Encounter (Signed)
-----   Message from Alda Berthold, DO sent at 12/18/2017  5:40 PM EDT ----- Please inform patient that his nerve test shows severe carpal tunnel syndrome, worse in the left hand.  This what is causing his numbness and tingling of the hands.  There is also right ulnar neuropathy at the elbow, but this is mild.  Recommend that he see hand orthopeadics for evaluation. In the meantime, he can start wearing wrist splints during the day and night to keep the wrists from over flexing. Thanks.

## 2017-12-25 ENCOUNTER — Ambulatory Visit: Payer: BLUE CROSS/BLUE SHIELD | Admitting: Family Medicine

## 2017-12-25 DIAGNOSIS — Z0289 Encounter for other administrative examinations: Secondary | ICD-10-CM

## 2017-12-26 ENCOUNTER — Encounter: Payer: Self-pay | Admitting: Family Medicine

## 2017-12-26 ENCOUNTER — Ambulatory Visit (INDEPENDENT_AMBULATORY_CARE_PROVIDER_SITE_OTHER): Payer: BLUE CROSS/BLUE SHIELD | Admitting: Family Medicine

## 2017-12-26 VITALS — BP 110/80 | HR 89 | Temp 98.7°F | Wt 186.9 lb

## 2017-12-26 DIAGNOSIS — G5602 Carpal tunnel syndrome, left upper limb: Secondary | ICD-10-CM

## 2017-12-26 NOTE — Progress Notes (Signed)
Subjective:     Patient ID: Luke Mcgrath, male   DOB: 1985-10-12, 32 y.o.   MRN: [id number]  HPI Patient seen with 2 month history of some left hand wrist and forearm pain and intermittent numbness especially involving the first through third digits of the left hand. He is left-hand dominant.  He has history of arteriovenous fistula spinal cord and was initially concerned that symptoms may be related to recurrence of that. He went to neurologist and had MRI cervical spine which showed no significant changes. He had EMG study which showed severe carpal tunnel left side. Has not tried wrist splint.  No recent injury. Symptoms are worse at night. He has pending appointment with hand surgeon  Past Medical History:  Diagnosis Date  . Anxiety   . Cavernous malformation 09/2013   Cervical region   Past Surgical History:  Procedure Laterality Date  . BRAIN SURGERY  1990   age 89  . CERVICAL LAMINECTOMY FOR EXCISION OF NEOPLASM  2015   Cervical cavernoma at C6-7  . RECONSTRUCTION OF NOSE  1994    reports that he has never smoked. He has never used smokeless tobacco. He reports that he drinks alcohol. He reports that he does not use drugs. family history includes Cancer in his maternal grandmother; Diabetes in his father; Healthy in his mother and sister; Hyperlipidemia in his father and maternal grandmother; Hypertension in his father; Other in his brother. No Known Allergies   Review of Systems  Respiratory: Negative for shortness of breath.   Cardiovascular: Negative for chest pain.  Neurological: Positive for numbness. Negative for weakness.       Objective:   Physical Exam  Constitutional: He appears well-developed and well-nourished.  Cardiovascular: Normal rate and regular rhythm.  Pulmonary/Chest: Effort normal and breath sounds normal. No respiratory distress. He has no wheezes.  Musculoskeletal:  No visible muscle atrophy of upper extremities. Full range of motion left  wrist  Neurological:  Full strength upper extremities. Normal sensory function to touch       Assessment:     Left carpal tunnel syndrome    Plan:     -Recommend wrist splint especially at night for the next several weeks -Keep follow-up with hand orthopedic specialist  Eulas Post MD [facility]

## 2017-12-26 NOTE — Patient Instructions (Signed)
Carpal Tunnel Syndrome Carpal tunnel syndrome is a condition that causes pain in your hand and arm. The carpal tunnel is a narrow area located on the palm side of your wrist. Repeated wrist motion or certain diseases may cause swelling within the tunnel. This swelling pinches the main nerve in the wrist (median nerve). What are the causes? This condition may be caused by:  Repeated wrist motions.  Wrist injuries.  Arthritis.  A cyst or tumor in the carpal tunnel.  Fluid buildup during pregnancy.  Sometimes the cause of this condition is not known. What increases the risk? This condition is more likely to develop in:  People who have jobs that cause them to repeatedly move their wrists in the same motion, such as Art gallery manager.  Women.  People with certain conditions, such as: ? Diabetes. ? Obesity. ? An underactive thyroid (hypothyroidism). ? Kidney failure.  What are the signs or symptoms? Symptoms of this condition include:  A tingling feeling in your fingers, especially in your thumb, index, and middle fingers.  Tingling or numbness in your hand.  An aching feeling in your entire arm, especially when your wrist and elbow are bent for long periods of time.  Wrist pain that goes up your arm to your shoulder.  Pain that goes down into your palm or fingers.  A weak feeling in your hands. You may have trouble grabbing and holding items.  Your symptoms may feel worse during the night. How is this diagnosed? This condition is diagnosed with a medical history and physical exam. You may also have tests, including:  An electromyogram (EMG). This test measures electrical signals sent by your nerves into the muscles.  X-rays.  How is this treated? Treatment for this condition includes:  Lifestyle changes. It is important to stop doing or modify the activity that caused your condition.  Physical or occupational therapy.  Medicines for pain and inflammation.  This may include medicine that is injected into your wrist.  A wrist splint.  Surgery.  Follow these instructions at home: If you have a splint:  Wear it as told by your health care provider. Remove it only as told by your health care provider.  Loosen the splint if your fingers become numb and tingle, or if they turn cold and blue.  Keep the splint clean and dry. General instructions  Take over-the-counter and prescription medicines only as told by your health care provider.  Rest your wrist from any activity that may be causing your pain. If your condition is work related, talk to your employer about changes that can be made, such as getting a wrist pad to use while typing.  If directed, apply ice to the painful area: ? Put ice in a plastic bag. ? Place a towel between your skin and the bag. ? Leave the ice on for 20 minutes, 2-3 times per day.  Keep all follow-up visits as told by your health care provider. This is important.  Do any exercises as told by your health care provider, physical therapist, or occupational therapist. Contact a health care provider if:  You have new symptoms.  Your pain is not controlled with medicines.  Your symptoms get worse. This information is not intended to replace advice given to you by your health care provider. Make sure you discuss any questions you have with your health care provider. Document Released: 08/25/2000 Document Revised: 01/06/2016 Document Reviewed: 01/13/2015 Elsevier Interactive Patient Education  2018 [place] a  wrist splint and use this every night and may use during day as well.  Follow up with hand specialist, as scheduled.

## 2018-01-02 ENCOUNTER — Encounter: Payer: BLUE CROSS/BLUE SHIELD | Admitting: Family Medicine

## 2018-01-02 DIAGNOSIS — Z2089 Contact with and (suspected) exposure to other communicable diseases: Secondary | ICD-10-CM

## 2018-01-22 DIAGNOSIS — G5602 Carpal tunnel syndrome, left upper limb: Secondary | ICD-10-CM | POA: Diagnosis not present

## 2018-01-22 DIAGNOSIS — G5601 Carpal tunnel syndrome, right upper limb: Secondary | ICD-10-CM | POA: Diagnosis not present

## 2018-01-30 DIAGNOSIS — Z5181 Encounter for therapeutic drug level monitoring: Secondary | ICD-10-CM | POA: Diagnosis not present

## 2018-01-30 DIAGNOSIS — G890 Central pain syndrome: Secondary | ICD-10-CM | POA: Diagnosis not present

## 2018-02-06 DIAGNOSIS — G5602 Carpal tunnel syndrome, left upper limb: Secondary | ICD-10-CM | POA: Diagnosis not present

## 2018-02-15 DIAGNOSIS — F419 Anxiety disorder, unspecified: Secondary | ICD-10-CM | POA: Diagnosis not present

## 2018-02-19 DIAGNOSIS — G5602 Carpal tunnel syndrome, left upper limb: Secondary | ICD-10-CM | POA: Diagnosis not present

## 2018-04-12 ENCOUNTER — Other Ambulatory Visit: Payer: Self-pay | Admitting: Neurology

## 2018-07-23 ENCOUNTER — Ambulatory Visit (HOSPITAL_COMMUNITY)
Admission: EM | Admit: 2018-07-23 | Discharge: 2018-07-23 | Disposition: A | Discharge status: Home / Self Care | Payer: BLUE CROSS/BLUE SHIELD | Attending: Family Medicine | Admitting: Family Medicine

## 2018-07-23 ENCOUNTER — Encounter (HOSPITAL_COMMUNITY): Payer: Self-pay

## 2018-07-23 DIAGNOSIS — J020 Streptococcal pharyngitis: Secondary | ICD-10-CM | POA: Diagnosis not present

## 2018-07-23 DIAGNOSIS — Z79899 Other long term (current) drug therapy: Secondary | ICD-10-CM | POA: Insufficient documentation

## 2018-07-23 DIAGNOSIS — R51 Headache: Secondary | ICD-10-CM

## 2018-07-23 DIAGNOSIS — Z20818 Contact with and (suspected) exposure to other bacterial communicable diseases: Secondary | ICD-10-CM

## 2018-07-23 DIAGNOSIS — F419 Anxiety disorder, unspecified: Secondary | ICD-10-CM | POA: Diagnosis not present

## 2018-07-23 DIAGNOSIS — Z8249 Family history of ischemic heart disease and other diseases of the circulatory system: Secondary | ICD-10-CM | POA: Insufficient documentation

## 2018-07-23 DIAGNOSIS — R1110 Vomiting, unspecified: Secondary | ICD-10-CM | POA: Diagnosis not present

## 2018-07-23 DIAGNOSIS — J029 Acute pharyngitis, unspecified: Secondary | ICD-10-CM | POA: Diagnosis not present

## 2018-07-23 LAB — POCT RAPID STREP A: STREPTOCOCCUS, GROUP A SCREEN (DIRECT): NEGATIVE

## 2018-07-23 MED ORDER — ONDANSETRON HCL 4 MG PO TABS: 4.0000 mg | ORAL_TABLET | Freq: Four times a day (QID) | ORAL | 0 refills | Status: DC

## 2018-07-23 MED ORDER — PENICILLIN V POTASSIUM 500 MG PO TABS: 500.0000 mg | ORAL_TABLET | Freq: Two times a day (BID) | ORAL | 0 refills | Status: AC

## 2018-07-23 NOTE — Discharge Instructions (Signed)
Take penicillin for 10 full days Take the Zofran as needed for nausea and vomiting Push fluids If your throat culture is positive you will be phoned.  If your throat culture is negative you will not.  If you want to know the status of your throat culture in 3 to 4 days, you may call the office.

## 2018-07-23 NOTE — ED Provider Notes (Signed)
Luke Mcgrath    CSN: [id number] Arrival date & time: 07/23/18  1105     History   Chief Complaint Chief Complaint  Patient presents with  . Sore Throat  . Emesis    HPI Luke Mcgrath is a 32 y.o. male.   HPI  Patient is here for sore throat.  He had a severe sore throat for couple of days.  Is traveling out of town with a coworker for a few days.  A coworker had strep throat.  He had exposure to strep throat.  He also has nausea.  He vomited last night.  He has a headache.  He thinks he has had a fever. He has had no runny stuffy nose.  No cough.  Past Medical History:  Diagnosis Date  . Anxiety   . Cavernous malformation 09/2013   Cervical region    Patient Active Problem List   Diagnosis Date Noted  . Prurigo nodularis 06/18/2017  . 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 Neuralgia of left thigh 07/01/2013  . Chronic anxiety 07/01/2013    Past Surgical History:  Procedure Laterality Date  . BRAIN SURGERY  1990   age 49  . CERVICAL LAMINECTOMY FOR EXCISION OF NEOPLASM  2015   Cervical cavernoma at C6-7  . RECONSTRUCTION OF NOSE  1994       Home Medications    Prior to Admission medications   Medication Sig Start Date End Date Taking? Authorizing Provider  acetaminophen (TYLENOL) 500 MG tablet Take by mouth.    [provider]  DULoxetine (CYMBALTA) 60 MG capsule Take 1 capsule (60 mg total) by mouth daily. 10/15/17   Narda Amber K, DO  ibuprofen (ADVIL,MOTRIN) 200 MG tablet Take by mouth.    [provider]  omeprazole (PRILOSEC) 20 MG capsule Take by mouth.    [provider]  ondansetron (ZOFRAN) 4 MG tablet Take 1 tablet (4 mg total) by mouth every 6 (six) hours. 07/23/18   Raylene Everts, MD  penicillin v potassium (VEETID) 500 MG tablet Take 1 tablet (500 mg total) by mouth 2 (two) times daily for 10 days. 07/23/18 08/02/18  Raylene Everts, MD  XTAMPZA ER 13.5 MG C12A TAKE ONE CAPSULE  BY MOUTH EVERY 12 HOURS WITH FOOD \ 06/04/17   [provider]    Family History Family History  Problem Relation Age of Onset  . Healthy Mother   . Hyperlipidemia Father   . Hypertension Father   . Diabetes Father   . Cancer Maternal Grandmother        colon  . Hyperlipidemia Maternal Grandmother   . Other Brother        Died, [employer] in [place]  . Healthy Sister     Social History Social History   Tobacco Use  . Smoking status: Never Smoker  . Smokeless tobacco: Never Used  Substance Use Topics  . Alcohol use: Yes    Comment: occasionally   . Drug use: No     Allergies   Patient has no known allergies.   Review of Systems Review of Systems  Constitutional: Negative for chills and fever.  HENT: Positive for sore throat. Negative for ear pain.   Eyes: Negative for pain and visual disturbance.  Respiratory: Negative for cough and shortness of breath.   Cardiovascular: Negative for chest pain and palpitations.  Gastrointestinal: Positive for nausea and vomiting. Negative for abdominal pain.  Genitourinary: Negative for dysuria and hematuria.  Musculoskeletal: Negative for arthralgias and back pain.  Skin: Negative for color change and rash.  Neurological: Positive for headaches. Negative for seizures and syncope.  All other systems reviewed and are negative.    Physical Exam Triage Vital Signs ED Triage Vitals  Enc Vitals Group     BP 07/23/18 1123 (!) 154/90     Pulse Rate 07/23/18 1123 97     Resp 07/23/18 1123 20     Temp 07/23/18 1123 98.3 F (36.8 C)     Temp Source 07/23/18 1123 Oral     SpO2 07/23/18 1123 100 %     Weight --      Height --      Head Circumference --      Peak Flow --      Pain Score 07/23/18 1124 6     Pain Loc --      Pain Edu? --      Excl. in [place]? --    No data found.  Updated Vital Signs BP (!) 154/90 (BP Location: Left Arm)   Pulse 97   Temp 98.3 F (36.8 C) (Oral)   Resp 20   SpO2 100%   Visual  Acuity Right Eye Distance:   Left Eye Distance:   Bilateral Distance:    Right Eye Near:   Left Eye Near:    Bilateral Near:     Physical Exam  Constitutional: He appears well-developed and well-nourished. He appears ill. No distress.  HENT:  Head: Normocephalic and atraumatic.  Right Ear: Hearing, tympanic membrane and ear canal normal.  Left Ear: Hearing, tympanic membrane and ear canal normal.  Mouth/Throat: Uvula is midline, oropharynx is clear and moist and mucous membranes are normal. Tonsils are 2+ on the right. Tonsils are 2+ on the left. No tonsillar exudate.  Posterior pharynx injected.  Soft palate and uvula have few petechiae.  No exudate  Eyes: Pupils are equal, round, and reactive to light. Conjunctivae are normal.  Neck: Normal range of motion. Neck supple.  Cardiovascular: Normal rate, regular rhythm and normal heart sounds.  Pulmonary/Chest: Effort normal and breath sounds normal. No respiratory distress.  Abdominal: Soft. He exhibits no distension.  Musculoskeletal: Normal range of motion. He exhibits no edema.  Lymphadenopathy:    He has cervical adenopathy.  Neurological: He is alert.  Skin: Skin is warm and dry.  Psychiatric: He has a normal mood and affect. His behavior is normal.     UC Treatments / Results  Labs (all labs ordered are listed, but only abnormal results are displayed) Labs Reviewed  CULTURE, GROUP A STREP [facility])  POCT RAPID STREP A    EKG None  Radiology No results found.  Procedures Procedures (including critical care time)  Medications Ordered in UC Medications - No data to display  Initial Impression / Assessment and Plan / UC Course  I have reviewed the triage vital signs and the nursing notes.  Pertinent labs & imaging results that were available during my care of the patient were reviewed by me and considered in my medical decision making (see chart for details).  Clinical Course as of Jul 23 2012  Tue Jul 23, 2018    1133 POCT Rapid Strep A [YN]    Clinical Course User Index [YN] Raylene Everts, MD    Rapid strep test is negative.  Is not 100% accurate, the patient had definite strep exposure.  I think it is best to cover him with antibiotics until the culture  report comes back.  Discussed that he can call and with a negative culture stop antibiotics early.  Otherwise he needs to take 10 full days. Final Clinical Impressions(s) / UC Diagnoses   Final diagnoses:  Strep throat exposure  Pharyngitis due to Streptococcus species     Discharge Instructions     Take penicillin for 10 full days Take the Zofran as needed for nausea and vomiting Push fluids If your throat culture is positive you will be phoned.  If your throat culture is negative you will not.  If you want to know the status of your throat culture in 3 to 4 days, you may call the office.   ED Prescriptions    Medication Sig Dispense Auth. Provider   penicillin v potassium (VEETID) 500 MG tablet Take 1 tablet (500 mg total) by mouth 2 (two) times daily for 10 days. 20 tablet Raylene Everts, MD   ondansetron (ZOFRAN) 4 MG tablet Take 1 tablet (4 mg total) by mouth every 6 (six) hours. 12 tablet Raylene Everts, MD     Controlled Substance Prescriptions [place] Controlled Substance Registry consulted? Not Applicable   Raylene Everts, MD 07/23/18 2015

## 2018-07-23 NOTE — ED Triage Notes (Signed)
Pt presents with sore throat and vomiting; was exposed to someone at work that tested positive with strep.

## 2018-07-25 LAB — CULTURE, GROUP A STREP (THRC)

## 2018-09-11 HISTORY — PX: CARPAL TUNNEL RELEASE: SHX101

## 2018-10-21 ENCOUNTER — Ambulatory Visit (INDEPENDENT_AMBULATORY_CARE_PROVIDER_SITE_OTHER): Payer: Self-pay | Admitting: Orthopaedic Surgery

## 2018-11-12 ENCOUNTER — Ambulatory Visit (HOSPITAL_COMMUNITY)
Admission: EM | Admit: 2018-11-12 | Discharge: 2018-11-12 | Disposition: A | Discharge status: Home / Self Care | Payer: BLUE CROSS/BLUE SHIELD | Attending: Family Medicine | Admitting: Family Medicine

## 2018-11-12 ENCOUNTER — Encounter (HOSPITAL_COMMUNITY): Payer: Self-pay | Admitting: Emergency Medicine

## 2018-11-12 ENCOUNTER — Other Ambulatory Visit: Payer: Self-pay

## 2018-11-12 DIAGNOSIS — B349 Viral infection, unspecified: Secondary | ICD-10-CM

## 2018-11-12 NOTE — ED Triage Notes (Signed)
Pt complains of dizziness and body aches that started this weekend.  He states he also had a low grade fever of 99-100.7.  Pt states his mother had the flu a few weeks ago and he borrowed her car and is now concerned for flu or dehydration.

## 2018-11-12 NOTE — Discharge Instructions (Signed)
Increase fluid intake- try to drink 1/2 body weight in oz Eat balanced diet Make slow movements Follow up if symptoms worsening

## 2018-11-12 NOTE — ED Provider Notes (Addendum)
[place]    CSN: [id number] Arrival date & time: 11/12/18  1522     History   Chief Complaint Chief Complaint  Patient presents with  . Generalized Body Aches  . Dizziness    HPI Luke Mcgrath is a 33 y.o. male no contributing past medical history presenting today for evaluation of lightheadedness.  Patient states that over the past couple days he had low-grade fevers and as well as feeling lightheaded.  He notes that he has had decreased eating and decreased sleeping habits over the past week.  He feels he lost 5 to 10 pounds this past week.  Most of his symptoms developed over the past couple of days.  He has tried taking DayQuil and ibuprofen.  He denies any congestion, sore throat or cough.  He does note he was exposed to flu a couple weeks ago.  He had surgery felt dehydrated and started to work on increasing his sleep and eating.  He feels his symptoms are starting to improve and is on the "tail end of things.".  He has had intermittent headaches, denies vision changes.  Denies chest pain or shortness of breath.  Denies leg pain or leg swelling.  Has had some mild nausea, denies vomiting, diarrhea or abdominal pain.  HPI  Past Medical History:  Diagnosis Date  . Anxiety   . Cavernous malformation 09/2013   Cervical region    Patient Active Problem List   Diagnosis Date Noted  . Prurigo nodularis 06/18/2017  . 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 Neuralgia of left thigh 07/01/2013  . Chronic anxiety 07/01/2013    Past Surgical History:  Procedure Laterality Date  . BRAIN SURGERY  1990   age 47  . CERVICAL LAMINECTOMY FOR EXCISION OF NEOPLASM  2015   Cervical cavernoma at C6-7  . RECONSTRUCTION OF NOSE  1994       Home Medications    Prior to Admission medications   Medication Sig Start Date End Date Taking? Authorizing Provider  acetaminophen (TYLENOL) 500 MG tablet Take by mouth.    [provider]  DULoxetine  (CYMBALTA) 60 MG capsule Take 1 capsule (60 mg total) by mouth daily. 10/15/17   Narda Amber K, DO  ibuprofen (ADVIL,MOTRIN) 200 MG tablet Take by mouth.    [provider]  omeprazole (PRILOSEC) 20 MG capsule Take by mouth.    [provider]  ondansetron (ZOFRAN) 4 MG tablet Take 1 tablet (4 mg total) by mouth every 6 (six) hours. 07/23/18   Raylene Everts, MD  XTAMPZA ER 13.5 MG C12A TAKE ONE CAPSULE BY MOUTH EVERY 12 HOURS WITH FOOD \ 06/04/17   [provider]    Family History Family History  Problem Relation Age of Onset  . Healthy Mother   . Hyperlipidemia Father   . Hypertension Father   . Diabetes Father   . Cancer Maternal Grandmother        colon  . Hyperlipidemia Maternal Grandmother   . Other Brother        Died, [employer] in [place]  . Healthy Sister     Social History Social History   Tobacco Use  . Smoking status: Never Smoker  . Smokeless tobacco: Never Used  Substance Use Topics  . Alcohol use: Yes    Comment: occasionally   . Drug use: Yes    Types: Marijuana     Allergies   Patient has no known allergies.  Review of Systems Review of Systems  Constitutional: Positive for activity change, appetite change, fatigue and fever.  HENT: Negative for congestion, sinus pressure and sore throat.   Eyes: Negative for photophobia, pain and visual disturbance.  Respiratory: Negative for cough and shortness of breath.   Cardiovascular: Negative for chest pain.  Gastrointestinal: Positive for nausea. Negative for abdominal pain and vomiting.  Genitourinary: Negative for decreased urine volume and hematuria.  Musculoskeletal: Negative for myalgias, neck pain and neck stiffness.  Neurological: Positive for light-headedness and headaches. Negative for dizziness, syncope, facial asymmetry, speech difficulty, weakness and numbness.     Physical Exam Triage Vital Signs ED Triage Vitals [11/12/18 1539]  Enc Vitals Group      BP 124/83     Pulse Rate 91     Resp 14     Temp 99.1 F (37.3 C)     Temp Source Oral     SpO2 99 %     Weight      Height      Head Circumference      Peak Flow      Pain Score 0     Pain Loc      Pain Edu?      Excl. in [place]?    No data found.  Updated Vital Signs BP 124/83 (BP Location: Right Arm)   Pulse 91   Temp 99.1 F (37.3 C) (Oral)   Resp 14   SpO2 99%   Visual Acuity Right Eye Distance:   Left Eye Distance:   Bilateral Distance:    Right Eye Near:   Left Eye Near:    Bilateral Near:     Physical Exam Vitals signs and nursing note reviewed.  Constitutional:      Appearance: He is well-developed.  HENT:     Head: Normocephalic and atraumatic.     Ears:     Comments: Bilateral ears without tenderness to palpation of external auricle, tragus and mastoid, EAC's without erythema or swelling, TM's with good bony landmarks and cone of light. Non erythematous.     Mouth/Throat:     Comments: Oral mucosa pink and moist, no tonsillar enlargement or exudate. Posterior pharynx patent and nonerythematous, no uvula deviation or swelling. Normal phonation.  Eyes:     Conjunctiva/sclera: Conjunctivae normal.  Neck:     Musculoskeletal: Neck supple.  Cardiovascular:     Rate and Rhythm: Normal rate and regular rhythm.     Heart sounds: No murmur.  Pulmonary:     Effort: Pulmonary effort is normal. No respiratory distress.     Breath sounds: Normal breath sounds.     Comments: Breathing comfortably at rest, CTABL, no wheezing, rales or other adventitious sounds auscultated Abdominal:     Palpations: Abdomen is soft.     Tenderness: There is no abdominal tenderness.     Comments: No tenderness throughout abdomen  Skin:    General: Skin is warm and dry.  Neurological:     Mental Status: He is alert.     Comments: Patient A&O x3, cranial nerves II-XII grossly intact, strength at shoulders, hips and knees 5/5, equal bilaterally, patellar reflex 2+ bilaterally;  right slightly more hyperreflexic than left.  Gait without abnormality.      UC Treatments / Results  Labs (all labs ordered are listed, but only abnormal results are displayed) Labs Reviewed - No data to display  EKG None  Radiology No results found.  Procedures Procedures (including critical care time)  Medications Ordered in UC Medications - No data to display  Initial Impression / Assessment and Plan / UC Course  I have reviewed the triage vital signs and the nursing notes.  Pertinent labs & imaging results that were available during my care of the patient were reviewed by me and considered in my medical decision making (see chart for details).     Patient with lightheadedness and reported low-grade fevers.  Minimal URI symptoms.  Reported weight loss over the past week.  Feels symptoms improving.  No neuro deficit on exam, vital signs stable.  Offered blood work to check electrolytes as well as hemoglobin, patient declined further work-up at this time.  Patient hoping to continue to rest and focus on oral rehydration and will follow-up for further testing if not improving.  Possible viral illness causing symptoms versus mild dehydration.  Symptoms did not seem cardiac or pulmonary in origin.  Also advised to follow-up with PCP if symptoms persisting.  Follow-up here in emergency room if symptoms worsening.  Continue to monitor,Discussed strict return precautions. Patient verbalized understanding and is agreeable with plan.  Final Clinical Impressions(s) / UC Diagnoses   Final diagnoses:  Viral illness     Discharge Instructions     Increase fluid intake- try to drink 1/2 body weight in oz Eat balanced diet Make slow movements Follow up if symptoms worsening   ED Prescriptions    None     Controlled Substance Prescriptions [place] Controlled Substance Registry consulted? Not Applicable   Janith Lima, PA-C 11/12/18 [address] [place] C, PA-C 11/12/18  1623    Wieters, [place] C, [place] 11/12/18 1625

## 2019-03-03 DIAGNOSIS — M50323 Other cervical disc degeneration at C6-C7 level: Secondary | ICD-10-CM | POA: Diagnosis not present

## 2019-03-03 DIAGNOSIS — M546 Pain in thoracic spine: Secondary | ICD-10-CM | POA: Diagnosis not present

## 2019-03-03 DIAGNOSIS — M9902 Segmental and somatic dysfunction of thoracic region: Secondary | ICD-10-CM | POA: Diagnosis not present

## 2019-03-03 DIAGNOSIS — M5134 Other intervertebral disc degeneration, thoracic region: Secondary | ICD-10-CM | POA: Diagnosis not present

## 2019-03-04 DIAGNOSIS — M5134 Other intervertebral disc degeneration, thoracic region: Secondary | ICD-10-CM | POA: Diagnosis not present

## 2019-03-04 DIAGNOSIS — M9902 Segmental and somatic dysfunction of thoracic region: Secondary | ICD-10-CM | POA: Diagnosis not present

## 2019-03-04 DIAGNOSIS — M546 Pain in thoracic spine: Secondary | ICD-10-CM | POA: Diagnosis not present

## 2019-03-04 DIAGNOSIS — M50323 Other cervical disc degeneration at C6-C7 level: Secondary | ICD-10-CM | POA: Diagnosis not present

## 2019-03-20 ENCOUNTER — Encounter: Payer: Self-pay | Admitting: Neurology

## 2019-03-21 ENCOUNTER — Other Ambulatory Visit: Payer: Self-pay

## 2019-03-21 ENCOUNTER — Telehealth (INDEPENDENT_AMBULATORY_CARE_PROVIDER_SITE_OTHER): Payer: BC Managed Care – PPO | Admitting: Neurology

## 2019-03-21 VITALS — Ht 72.0 in | Wt 170.0 lb

## 2019-03-21 DIAGNOSIS — G959 Disease of spinal cord, unspecified: Secondary | ICD-10-CM

## 2019-03-21 DIAGNOSIS — M792 Neuralgia and neuritis, unspecified: Secondary | ICD-10-CM | POA: Diagnosis not present

## 2019-03-21 DIAGNOSIS — Q288 Other specified congenital malformations of circulatory system: Secondary | ICD-10-CM | POA: Diagnosis not present

## 2019-03-21 NOTE — Progress Notes (Signed)
Referral placed.

## 2019-03-21 NOTE — Progress Notes (Signed)
   Virtual Visit via Video Note The purpose of this virtual visit is to provide medical care while limiting exposure to the novel coronavirus.    Consent was obtained for video visit:  Yes.   Answered questions that patient had about telehealth interaction:  Yes.   I discussed the limitations, risks, security and privacy concerns of performing an evaluation and management service by telemedicine. I also discussed with the patient that there may be a patient responsible charge related to this service. The patient expressed understanding and agreed to proceed.  Pt location: Home Physician Location: office Name of referring provider:  Eulas Post, MD I connected with Luke Mcgrath at patients initiation/request on 03/21/2019 at  8:30 AM EDT by video enabled telemedicine application and verified that I am speaking with the correct person using two identifiers. Pt MRN:  [id number] Pt DOB:  [birth year] Video Participants:  Luke Mcgrath   History of Present Illness: This is a 33 y.o. male with history of cervical cavernous malformation C6-C7 s/p laminectomy and excision (Jan 3734) complicated by chronic painful paresthesias requesting referral for pain management specialist for ongoing left leg painful paresthesias.   He was last seen by me in February 2019 at which time repeat imaging of the cervical spine was performed and showed no significant change in the size of his cervical cavernous malformation.  He followed-up with Dr. Patsey Berthold at [facility], neurosurgery, who suggested findings could be scar tissue vs residual malformation.  Management is supportive and he was then referred to [facility] pain management and only saw them one time.  He is requesting a referral to a provider who is local.  He has not been on any medication for pain in almost 1.5 years and was previously followed at [facility].   Previously tried:  Gabapentin, Lyrica, Cymbalta, Amitriptyline, hydrocodone, oxycodone,  flexeril.  He is interestet in seeing if he is a candidate for neurostimulator.   Observations/Objective:   Vitals:   03/20/19 1514  Weight: 170 lb (77.1 kg)  Height: 6' (1.829 m)   Patient is awake, alert, and appears comfortable.  Oriented x 4.   Extraocular muscles are intact. No ptosis.  Face is symmetric.  Speech is not dysarthric. Tongue is midline. Antigravity in all extremities.  No pronator drift   Assessment and Plan:  Cervical cavernous malformation C6-C7 s/p laminectomy and excision performed at [facility] (January 2015) with chronic refractory painful paresthesias of the left leg.  I will refer him to [facility] for pain management.    Follow Up Instructions:   I discussed the assessment and treatment plan with the patient. The patient was provided an opportunity to ask questions and all were answered. The patient agreed with the plan and demonstrated an understanding of the instructions.   The patient was advised to call back or seek an in-person evaluation if the symptoms worsen or if the condition fails to improve as anticipated.  Total time spent:  15 min     Alda Berthold, DO

## 2019-03-21 NOTE — Addendum Note (Signed)
Addended by: Amado Coe on: 03/21/2019 10:58 AM   Modules accepted: Orders

## 2019-04-07 ENCOUNTER — Telehealth: Payer: Self-pay | Admitting: Neurology

## 2019-04-07 NOTE — Telephone Encounter (Signed)
Patient left VM about a referral. Please call him back at [phone]. Thanks!

## 2019-04-08 NOTE — Telephone Encounter (Signed)
Contacted patient and gave him the phone number to [facility]. Number given to patient

## 2019-04-24 ENCOUNTER — Telehealth: Payer: Self-pay | Admitting: Neurology

## 2019-04-24 DIAGNOSIS — G959 Disease of spinal cord, unspecified: Secondary | ICD-10-CM

## 2019-04-24 DIAGNOSIS — M792 Neuralgia and neuritis, unspecified: Secondary | ICD-10-CM

## 2019-04-24 DIAGNOSIS — Q288 Other specified congenital malformations of circulatory system: Secondary | ICD-10-CM

## 2019-04-24 NOTE — Telephone Encounter (Signed)
Referral Notes Number of Notes: 3 . Type Date User Summary Attachment  General 04/24/2019 10:59 AM Cindie Crumbly B - -  Note   Called Dr. Serita Grit office to clarify if SCS eval is requested . ( waiting to hear from [place])        . Type Date User Summary Attachment  General 04/16/2019 11:20 AM Roetta Sessions - -  Note   Sent inbasket to Narda Amber is she asking for Neuro stimulator if so needs to go to [facility] [place]        . Type Date User Summary Attachment  General 03/26/2019 1:19 PM Harle Battiest, Doristine Church B - -  Note   PLEASE ALLOW 4-6 WEEKS FOR MEDICAL REVIEW         ~Dr. Posey Pronto  Please Melony Overly

## 2019-04-24 NOTE — Telephone Encounter (Signed)
They need to know why they are seeing patient, they state that they have not heard anything from [place] and the patient has been waiting for about a month  Please call

## 2019-04-24 NOTE — Telephone Encounter (Signed)
OK to refer to Dr. Maryjean Ka at [facility] for pain management and evaluation for spinal cord stimulator.

## 2019-04-24 NOTE — Telephone Encounter (Signed)
Orders placed and fax

## 2019-04-25 DIAGNOSIS — Z1159 Encounter for screening for other viral diseases: Secondary | ICD-10-CM | POA: Diagnosis not present

## 2019-04-25 DIAGNOSIS — R5383 Other fatigue: Secondary | ICD-10-CM | POA: Diagnosis not present

## 2019-04-25 DIAGNOSIS — Z0000 Encounter for general adult medical examination without abnormal findings: Secondary | ICD-10-CM | POA: Diagnosis not present

## 2019-04-25 DIAGNOSIS — Z7251 High risk heterosexual behavior: Secondary | ICD-10-CM | POA: Diagnosis not present

## 2019-04-25 DIAGNOSIS — Z131 Encounter for screening for diabetes mellitus: Secondary | ICD-10-CM | POA: Diagnosis not present

## 2019-04-25 DIAGNOSIS — M792 Neuralgia and neuritis, unspecified: Secondary | ICD-10-CM | POA: Diagnosis not present

## 2019-04-25 DIAGNOSIS — Z114 Encounter for screening for human immunodeficiency virus [HIV]: Secondary | ICD-10-CM | POA: Diagnosis not present

## 2019-05-09 DIAGNOSIS — M792 Neuralgia and neuritis, unspecified: Secondary | ICD-10-CM | POA: Diagnosis not present

## 2019-05-09 DIAGNOSIS — M545 Low back pain: Secondary | ICD-10-CM | POA: Diagnosis not present

## 2019-05-09 DIAGNOSIS — Z79899 Other long term (current) drug therapy: Secondary | ICD-10-CM | POA: Diagnosis not present

## 2019-05-09 DIAGNOSIS — G8929 Other chronic pain: Secondary | ICD-10-CM | POA: Diagnosis not present

## 2019-06-06 DIAGNOSIS — M542 Cervicalgia: Secondary | ICD-10-CM | POA: Diagnosis not present

## 2019-06-06 DIAGNOSIS — G8929 Other chronic pain: Secondary | ICD-10-CM | POA: Diagnosis not present

## 2019-06-06 DIAGNOSIS — M545 Low back pain: Secondary | ICD-10-CM | POA: Diagnosis not present

## 2019-07-07 DIAGNOSIS — M545 Low back pain: Secondary | ICD-10-CM | POA: Diagnosis not present

## 2019-07-07 DIAGNOSIS — G8929 Other chronic pain: Secondary | ICD-10-CM | POA: Diagnosis not present

## 2019-08-06 DIAGNOSIS — M792 Neuralgia and neuritis, unspecified: Secondary | ICD-10-CM | POA: Diagnosis not present

## 2019-08-06 DIAGNOSIS — M545 Low back pain: Secondary | ICD-10-CM | POA: Diagnosis not present

## 2019-08-06 DIAGNOSIS — G8929 Other chronic pain: Secondary | ICD-10-CM | POA: Diagnosis not present

## 2019-08-06 DIAGNOSIS — E7800 Pure hypercholesterolemia, unspecified: Secondary | ICD-10-CM | POA: Diagnosis not present

## 2019-08-06 DIAGNOSIS — Z79899 Other long term (current) drug therapy: Secondary | ICD-10-CM | POA: Diagnosis not present

## 2019-08-06 DIAGNOSIS — Z1159 Encounter for screening for other viral diseases: Secondary | ICD-10-CM | POA: Diagnosis not present

## 2019-08-25 ENCOUNTER — Other Ambulatory Visit: Payer: Self-pay

## 2019-08-25 DIAGNOSIS — Z20822 Contact with and (suspected) exposure to covid-19: Secondary | ICD-10-CM

## 2019-08-27 LAB — NOVEL CORONAVIRUS, NAA: SARS-CoV-2, NAA: NOT DETECTED

## 2019-09-09 DIAGNOSIS — Z79899 Other long term (current) drug therapy: Secondary | ICD-10-CM | POA: Diagnosis not present

## 2019-09-09 DIAGNOSIS — M545 Low back pain: Secondary | ICD-10-CM | POA: Diagnosis not present

## 2019-09-09 DIAGNOSIS — G8929 Other chronic pain: Secondary | ICD-10-CM | POA: Diagnosis not present

## 2019-11-19 ENCOUNTER — Other Ambulatory Visit: Payer: Self-pay

## 2019-11-19 ENCOUNTER — Ambulatory Visit (HOSPITAL_COMMUNITY)
Admission: EM | Admit: 2019-11-19 | Discharge: 2019-11-19 | Discharge status: Against Medical Advice | Payer: BC Managed Care – PPO

## 2019-11-19 NOTE — ED Notes (Signed)
Patient called x 2 with no response.  All areas of lobby and outside checked, Pt. Access stated patient was seen leaving.

## 2019-11-19 NOTE — ED Notes (Signed)
No answer when called from the waiting to assign to tx room

## 2019-11-20 ENCOUNTER — Encounter (HOSPITAL_COMMUNITY): Payer: Self-pay | Admitting: Emergency Medicine

## 2019-11-20 ENCOUNTER — Ambulatory Visit (HOSPITAL_COMMUNITY)
Admission: EM | Admit: 2019-11-20 | Discharge: 2019-11-20 | Disposition: A | Discharge status: Home / Self Care | Payer: BC Managed Care – PPO | Attending: Internal Medicine | Admitting: Internal Medicine

## 2019-11-20 ENCOUNTER — Other Ambulatory Visit: Payer: Self-pay

## 2019-11-20 DIAGNOSIS — N50819 Testicular pain, unspecified: Secondary | ICD-10-CM

## 2019-11-20 DIAGNOSIS — Z113 Encounter for screening for infections with a predominantly sexual mode of transmission: Secondary | ICD-10-CM | POA: Diagnosis not present

## 2019-11-20 LAB — POCT URINALYSIS DIP (DEVICE)
Bilirubin Urine: NEGATIVE
Glucose, UA: NEGATIVE mg/dL
Hgb urine dipstick: NEGATIVE
Ketones, ur: NEGATIVE mg/dL
Leukocytes,Ua: NEGATIVE
Nitrite: NEGATIVE
Protein, ur: NEGATIVE mg/dL
Specific Gravity, Urine: 1.02 (ref 1.005–1.030)
Urobilinogen, UA: 0.2 mg/dL (ref 0.0–1.0)
pH: 7 (ref 5.0–8.0)

## 2019-11-20 LAB — HIV ANTIBODY (ROUTINE TESTING W REFLEX): HIV Screen 4th Generation wRfx: NONREACTIVE

## 2019-11-20 NOTE — ED Triage Notes (Signed)
Denies discharge, burning .  Left testicular pain for a couple of days.

## 2019-11-21 LAB — RPR: RPR Ser Ql: NONREACTIVE

## 2019-11-22 LAB — CYTOLOGY, (ORAL, ANAL, URETHRAL) ANCILLARY ONLY
Chlamydia: NEGATIVE
Neisseria Gonorrhea: NEGATIVE
Trichomonas: NEGATIVE

## 2019-11-24 NOTE — ED Provider Notes (Signed)
[place]    CSN: [id number] Arrival date & time: 11/20/19  1855      History   Chief Complaint Chief Complaint  Patient presents with  . SEXUALLY TRANSMITTED DISEASE    HPI Luke Mcgrath is a 34 y.o. male comes to urgent care requesting STD screening.  Patient recently engaged in unprotected sexual intercourse with a new sexual partner.  Patient complains of testicular pain.  No dysuria, urgency or frequency.  No penile discharge.  No groin pain.  He has had left testicular pain over the past couple of days.  No fever or chills.  No penile ulceration or rashes.  HPI  Past Medical History:  Diagnosis Date  . Anxiety   . Cavernous malformation 09/2013   Cervical region    Patient Active Problem List   Diagnosis Date Noted  . Carpal tunnel syndrome of left wrist 01/22/2018  . Carpal tunnel syndrome of right wrist 01/22/2018  . Prurigo nodularis 06/18/2017  . GERD (gastroesophageal reflux disease) 10/06/2013  . 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 Neuralgia of left thigh 07/01/2013  . Chronic anxiety 07/01/2013    Past Surgical History:  Procedure Laterality Date  . BRAIN SURGERY  1990   age 38  . CERVICAL LAMINECTOMY FOR EXCISION OF NEOPLASM  2015   Cervical cavernoma at C6-7  . RECONSTRUCTION OF NOSE  1994       Home Medications    Prior to Admission medications   Medication Sig Start Date End Date Taking? Authorizing Provider  cyclobenzaprine (FLEXERIL) 5 MG tablet as needed.    [provider]  oxyCODONE-acetaminophen (PERCOCET/ROXICET) 5-325 MG tablet Take 1 tablet by mouth 4 (four) times daily as needed. 08/06/19   [provider]  omeprazole (PRILOSEC) 20 MG capsule Take by mouth.  11/20/19  [provider]    Family History Family History  Problem Relation Age of Onset  . Healthy Mother   . Hyperlipidemia Father   . Hypertension Father   . Diabetes Father   . Cancer Maternal  Grandmother        colon  . Hyperlipidemia Maternal Grandmother   . Other Brother        Died, [employer] in [place]  . Healthy Sister     Social History Social History   Tobacco Use  . Smoking status: Never Smoker  . Smokeless tobacco: Never Used  Substance Use Topics  . Alcohol use: Yes    Comment: occasionally   . Drug use: Not Currently    Types: Marijuana     Allergies   Patient has no known allergies.   Review of Systems Review of Systems  Constitutional: Negative for activity change, chills, fatigue and fever.  Gastrointestinal: Negative for abdominal pain, nausea and vomiting.  Genitourinary: Positive for testicular pain. Negative for difficulty urinating, dysuria, flank pain, frequency, hematuria and scrotal swelling.  Musculoskeletal: Negative for arthralgias, joint swelling and myalgias.  Neurological: Negative for dizziness, light-headedness and headaches.  Psychiatric/Behavioral: Negative for confusion and decreased concentration.     Physical Exam Triage Vital Signs ED Triage Vitals  Enc Vitals Group     BP 11/20/19 1941 (!) 161/82     Pulse Rate 11/20/19 1941 83     Resp 11/20/19 1941 16     Temp 11/20/19 1941 98.2 F (36.8 C)     Temp Source 11/20/19 1941 Oral     SpO2 11/20/19 1941 98 %     Weight --  Height --      Head Circumference --      Peak Flow --      Pain Score 11/20/19 1943 6     Pain Loc --      Pain Edu? --      Excl. in [place]? --    No data found.  Updated Vital Signs BP (!) 161/82 (BP Location: Left Arm)   Pulse 83   Temp 98.2 F (36.8 C) (Oral)   Resp 16   SpO2 98%   Visual Acuity Right Eye Distance:   Left Eye Distance:   Bilateral Distance:    Right Eye Near:   Left Eye Near:    Bilateral Near:     Physical Exam Constitutional:      Appearance: Normal appearance.  Cardiovascular:     Rate and Rhythm: Normal rate and regular rhythm.     Pulses: Normal pulses.     Heart sounds: No murmur. No  friction rub.  Pulmonary:     Effort: Pulmonary effort is normal. No respiratory distress.     Breath sounds: No stridor.  Genitourinary:    Penis: Normal.      Testes: Normal.     Comments: No testicular swelling.  No masses palpated.  No tenderness on palpation of the spermatic cord. Musculoskeletal:        General: No swelling or tenderness. Normal range of motion.  Neurological:     Mental Status: He is alert.      UC Treatments / Results  Labs (all labs ordered are listed, but only abnormal results are displayed) Labs Reviewed  HIV ANTIBODY (ROUTINE TESTING W REFLEX)  RPR  POCT URINALYSIS DIP (DEVICE)  CYTOLOGY, (ORAL, ANAL, URETHRAL) ANCILLARY ONLY    EKG   Radiology No results found.  Procedures Procedures (including critical care time)  Medications Ordered in UC Medications - No data to display  Initial Impression / Assessment and Plan / UC Course  I have reviewed the triage vital signs and the nursing notes.  Pertinent labs & imaging results that were available during my care of the patient were reviewed by me and considered in my medical decision making (see chart for details).     1.  Possible exposure to STD: Safe sex practices education was given Urethral swab for GC/chlamydia/trichomonas HIV/RPR Urinalysis is negative for urinary tract infection, specific gravity is 1.020 If urethral swab results are significant, treatment plan will be updated to address the abnormal lab results. Return precautions given. Final Clinical Impressions(s) / UC Diagnoses   Final diagnoses:  Screen for STD (sexually transmitted disease)   Discharge Instructions   None    ED Prescriptions    None     PDMP not reviewed this encounter.   Chase Picket, MD 11/24/19 [phone]

## 2020-03-02 ENCOUNTER — Ambulatory Visit (HOSPITAL_COMMUNITY)
Admission: EM | Admit: 2020-03-02 | Discharge: 2020-03-02 | Disposition: A | Discharge status: Home / Self Care | Payer: BC Managed Care – PPO | Attending: Physician Assistant | Admitting: Physician Assistant

## 2020-03-02 ENCOUNTER — Other Ambulatory Visit: Payer: Self-pay

## 2020-03-02 ENCOUNTER — Encounter (HOSPITAL_COMMUNITY): Payer: Self-pay

## 2020-03-02 DIAGNOSIS — Z113 Encounter for screening for infections with a predominantly sexual mode of transmission: Secondary | ICD-10-CM | POA: Diagnosis not present

## 2020-03-02 LAB — HIV ANTIBODY (ROUTINE TESTING W REFLEX): HIV Screen 4th Generation wRfx: NONREACTIVE

## 2020-03-02 NOTE — ED Provider Notes (Signed)
[place]    CSN: [id number] Arrival date & time: 03/02/20  1037      History   Chief Complaint Chief Complaint  Patient presents with  . SEXUALLY TRANSMITTED DISEASE    HPI Luke Mcgrath is a 34 y.o. male.   Pt reports unprotected sex.  Pt request std test.  Pt denies any symptoms   The history is provided by the patient. No language interpreter was used.  Exposure to STD    Past Medical History:  Diagnosis Date  . Anxiety   . Cavernous malformation 09/2013   Cervical region    Patient Active Problem List   Diagnosis Date Noted  . Carpal tunnel syndrome of left wrist 01/22/2018  . Carpal tunnel syndrome of right wrist 01/22/2018  . Prurigo nodularis 06/18/2017  . GERD (gastroesophageal reflux disease) 10/06/2013  . 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 Neuralgia of left thigh 07/01/2013  . Chronic anxiety 07/01/2013    Past Surgical History:  Procedure Laterality Date  . BRAIN SURGERY  1990   age 89  . CERVICAL LAMINECTOMY FOR EXCISION OF NEOPLASM  2015   Cervical cavernoma at C6-7  . RECONSTRUCTION OF NOSE  1994       Home Medications    Prior to Admission medications   Medication Sig Start Date End Date Taking? Authorizing Provider  baclofen (LIORESAL) 10 MG tablet Take 10 mg by mouth 3 (three) times daily. 02/28/20  Yes [provider]  oxyCODONE-acetaminophen (PERCOCET/ROXICET) 5-325 MG tablet Take 1 tablet by mouth 4 (four) times daily as needed. 08/06/19  Yes [provider]  cyclobenzaprine (FLEXERIL) 5 MG tablet as needed.    [provider]  omeprazole (PRILOSEC) 20 MG capsule Take by mouth.  11/20/19  [provider]    Family History Family History  Problem Relation Age of Onset  . Healthy Mother   . Hyperlipidemia Father   . Hypertension Father   . Diabetes Father   . Cancer Maternal Grandmother        colon  . Hyperlipidemia Maternal Grandmother   . Other  Brother        Died, [employer] in [place]  . Healthy Sister     Social History Social History   Tobacco Use  . Smoking status: Never Smoker  . Smokeless tobacco: Never Used  Vaping Use  . Vaping Use: Never used  Substance Use Topics  . Alcohol use: Yes    Comment: occasionally   . Drug use: Not Currently    Types: Marijuana     Allergies   Patient has no known allergies.   Review of Systems Review of Systems  All other systems reviewed and are negative.    Physical Exam Triage Vital Signs ED Triage Vitals  Enc Vitals Group     BP 03/02/20 1102 139/89     Pulse Rate 03/02/20 1102 68     Resp 03/02/20 1102 18     Temp 03/02/20 1102 98.2 F (36.8 C)     Temp Source 03/02/20 1102 Oral     SpO2 03/02/20 1102 100 %     Weight --      Height --      Head Circumference --      Peak Flow --      Pain Score 03/02/20 1104 0     Pain Loc --      Pain Edu? --      Excl. in [place]? --  No data found.  Updated Vital Signs BP 139/89 (BP Location: Right Arm)   Pulse 68   Temp 98.2 F (36.8 C) (Oral)   Resp 18   SpO2 100%   Visual Acuity Right Eye Distance:   Left Eye Distance:   Bilateral Distance:    Right Eye Near:   Left Eye Near:    Bilateral Near:     Physical Exam Vitals and nursing note reviewed.  Constitutional:      Appearance: He is well-developed.  HENT:     Head: Normocephalic and atraumatic.  Eyes:     Conjunctiva/sclera: Conjunctivae normal.  Cardiovascular:     Rate and Rhythm: Normal rate.  Pulmonary:     Effort: Pulmonary effort is normal. No respiratory distress.     Breath sounds: Normal breath sounds.  Musculoskeletal:        General: Normal range of motion.     Cervical back: Normal range of motion.  Skin:    General: Skin is warm.  Neurological:     Mental Status: He is alert and oriented to person, place, and time.      UC Treatments / Results  Labs (all labs ordered are listed, but only abnormal results are  displayed) Labs Reviewed - No data to display  EKG   Radiology No results found.  Procedures Procedures (including critical care time)  Medications Ordered in UC Medications - No data to display  Initial Impression / Assessment and Plan / UC Course  I have reviewed the triage vital signs and the nursing notes.  Pertinent labs & imaging results that were available during my care of the patient were reviewed by me and considered in my medical decision making (see chart for details).     MDM: test ordered.  Pt currently asymptomatic.  Pt given information on safe sex.   Final Clinical Impressions(s) / UC Diagnoses   Final diagnoses:  Screening examination for STD (sexually transmitted disease)   Discharge Instructions   None    ED Prescriptions    None    An After Visit Summary was printed and given to the patient.  PDMP not reviewed this encounter.   Fransico Meadow, [place] 03/02/20 1205

## 2020-03-02 NOTE — ED Notes (Signed)
PT presenting today requesting STD testing. Pt denies any symptoms such as discharge, burning with urination. Pt endorses unprotected sex and request testing including HIV.

## 2020-03-03 LAB — CYTOLOGY, (ORAL, ANAL, URETHRAL) ANCILLARY ONLY
Chlamydia: NEGATIVE
Comment: NEGATIVE
Comment: NORMAL
Neisseria Gonorrhea: NEGATIVE

## 2020-03-03 LAB — RPR: RPR Ser Ql: NONREACTIVE

## 2020-04-07 ENCOUNTER — Ambulatory Visit: Payer: BC Managed Care – PPO | Admitting: Orthopaedic Surgery

## 2020-08-13 ENCOUNTER — Ambulatory Visit (HOSPITAL_COMMUNITY)
Admission: EM | Admit: 2020-08-13 | Discharge: 2020-08-13 | Disposition: A | Discharge status: Home / Self Care | Payer: BC Managed Care – PPO | Attending: Emergency Medicine | Admitting: Emergency Medicine

## 2020-08-13 ENCOUNTER — Encounter (HOSPITAL_COMMUNITY): Payer: Self-pay | Admitting: Emergency Medicine

## 2020-08-13 ENCOUNTER — Other Ambulatory Visit: Payer: Self-pay

## 2020-08-13 DIAGNOSIS — R110 Nausea: Secondary | ICD-10-CM

## 2020-08-13 DIAGNOSIS — R112 Nausea with vomiting, unspecified: Secondary | ICD-10-CM

## 2020-08-13 DIAGNOSIS — Z113 Encounter for screening for infections with a predominantly sexual mode of transmission: Secondary | ICD-10-CM | POA: Insufficient documentation

## 2020-08-13 DIAGNOSIS — Z20822 Contact with and (suspected) exposure to covid-19: Secondary | ICD-10-CM | POA: Insufficient documentation

## 2020-08-13 LAB — CBC WITH DIFFERENTIAL/PLATELET
Abs Immature Granulocytes: 0.01 10*3/uL (ref 0.00–0.07)
Basophils Absolute: 0 10*3/uL (ref 0.0–0.1)
Basophils Relative: 1 %
Eosinophils Absolute: 0.1 10*3/uL (ref 0.0–0.5)
Eosinophils Relative: 2 %
HCT: 44.7 % (ref 39.0–52.0)
Hemoglobin: 15.4 g/dL (ref 13.0–17.0)
Immature Granulocytes: 0 %
Lymphocytes Relative: 41 %
Lymphs Abs: 1.6 10*3/uL (ref 0.7–4.0)
MCH: 29.6 pg (ref 26.0–34.0)
MCHC: 34.5 g/dL (ref 30.0–36.0)
MCV: 86 fL (ref 80.0–100.0)
Monocytes Absolute: 0.5 10*3/uL (ref 0.1–1.0)
Monocytes Relative: 13 %
Neutro Abs: 1.8 10*3/uL (ref 1.7–7.7)
Neutrophils Relative %: 43 %
Platelets: 277 10*3/uL (ref 150–400)
RBC: 5.2 MIL/uL (ref 4.22–5.81)
RDW: 11.8 % (ref 11.5–15.5)
WBC: 4.1 10*3/uL (ref 4.0–10.5)
nRBC: 0 % (ref 0.0–0.2)

## 2020-08-13 LAB — COMPREHENSIVE METABOLIC PANEL
ALT: 14 U/L (ref 0–44)
AST: 19 U/L (ref 15–41)
Albumin: 3.8 g/dL (ref 3.5–5.0)
Alkaline Phosphatase: 57 U/L (ref 38–126)
Anion gap: 15 (ref 5–15)
BUN: 10 mg/dL (ref 6–20)
CO2: 23 mmol/L (ref 22–32)
Calcium: 9 mg/dL (ref 8.9–10.3)
Chloride: 98 mmol/L (ref 98–111)
Creatinine, Ser: 0.98 mg/dL (ref 0.61–1.24)
GFR, Estimated: >60 mL/min (ref >60)
Glucose, Bld: 115 mg/dL — ABNORMAL HIGH (ref 70–99)
Potassium: 3.4 mmol/L — ABNORMAL LOW (ref 3.5–5.1)
Sodium: 136 mmol/L (ref 135–145)
Total Bilirubin: 0.7 mg/dL (ref 0.3–1.2)
Total Protein: 6.6 g/dL (ref 6.5–8.1)

## 2020-08-13 LAB — POCT URINALYSIS DIPSTICK, ED / UC
Glucose, UA: NEGATIVE mg/dL
Ketones, ur: 15 mg/dL — AB
Leukocytes,Ua: NEGATIVE
Nitrite: NEGATIVE
Protein, ur: NEGATIVE mg/dL
Specific Gravity, Urine: 1.025 (ref 1.005–1.030)
Urobilinogen, UA: 0.2 mg/dL (ref 0.0–1.0)
pH: 5 (ref 5.0–8.0)

## 2020-08-13 LAB — LIPASE, BLOOD: Lipase: 24 U/L (ref 11–51)

## 2020-08-13 LAB — HIV ANTIBODY (ROUTINE TESTING W REFLEX): HIV Screen 4th Generation wRfx: NONREACTIVE

## 2020-08-13 MED ORDER — OMEPRAZOLE 20 MG PO CPDR: 20.0000 mg | DELAYED_RELEASE_CAPSULE | Freq: Every day | ORAL | 0 refills | Status: DC

## 2020-08-13 MED ORDER — ONDANSETRON 4 MG PO TBDP: 4.0000 mg | ORAL_TABLET | Freq: Three times a day (TID) | ORAL | 0 refills | Status: DC | PRN

## 2020-08-13 NOTE — ED Triage Notes (Addendum)
Pt c/o fever and emesis x 1 week. Pt states he had some leftover ondansetron that he took that helped with the nausea. Pt states he would like to be checked for STDs. He denies any symptoms. He states he did not have any diarrhea. Pt states he urinated on Wednesday and his urine was very dark and looked muddy and he is concerned about dehydration. Pt states since then he has been drinking more water and urine is more clear.

## 2020-08-13 NOTE — Discharge Instructions (Signed)
Small frequent sips of fluids- Pedialyte, Gatorade, water, broth- to maintain hydration.   Zofran every 8 hours as needed for nausea or vomiting.   Omeprazole daily to prevent and/or treat any stomach ulcers.  We will notify of you any positive findings or if any changes to treatment are needed from your lab tests. If normal or otherwise without concern to your results, we will not call you. Please log on to your MyChart to review your results if interested in so.

## 2020-08-13 NOTE — ED Provider Notes (Signed)
[place]    CSN: [id number] Arrival date & time: 08/13/20  1213      History   Chief Complaint Chief Complaint  Patient presents with  . Emesis  . Fever  . SEXUALLY TRANSMITTED DISEASE    HPI Luke Mcgrath is a 34 y.o. male.   Luke Mcgrath presents with complaints of nausea and vomiting. 8 days ago, the evening of thanksgiving on his way home he felt nausea and vomited. The following days had some intermittent vomiting. He took left over zofran which did help but then 5 days ago he started vomiting, had diarrhea, and noted a fever. He vomited once last night. Was able to go to work two days ago. No other URI symptoms. Prior to vomiting he feels and epigastric squeezing type sensation but otherwise without any abdominal pain. No current pain. Has been drinking liquids and tolerating. No further diarrhea. Occasionally has noted some "specks" of black to emesis, otherwise no blood to emesis or stool. Denies dizziness or light headedness.  He thinks his girlfriend's daughter had vomiting last week but uncertain. States he initially thought his vomiting was related to significant stress he was experiencing prior to onset. States he at one point his urine appeared "muddy" but since drinking more water it has cleared. Endorses drinking alcohol daily, 1-2 drinks. Chronic pain, percocet for pain with pain contract. Requests std screening, unprotected sex with 1 partner without any particular known exposures or concerns.    ROS per HPI, negative if not otherwise mentioned.      Past Medical History:  Diagnosis Date  . Anxiety   . Cavernous malformation 09/2013   Cervical region    Patient Active Problem List   Diagnosis Date Noted  . Carpal tunnel syndrome of left wrist 01/22/2018  . Carpal tunnel syndrome of right wrist 01/22/2018  . Prurigo nodularis 06/18/2017  . GERD (gastroesophageal reflux disease) 10/06/2013  . Arteriovenous fistula of spinal cord vessels  08/01/2013  . Neuralgia of left thigh 07/01/2013  . Chronic anxiety 07/01/2013    Past Surgical History:  Procedure Laterality Date  . BRAIN SURGERY  1990   age 14  . CERVICAL LAMINECTOMY FOR EXCISION OF NEOPLASM  2015   Cervical cavernoma at C6-7  . RECONSTRUCTION OF NOSE  1994       Home Medications    Prior to Admission medications   Medication Sig Start Date End Date Taking? Authorizing Provider  baclofen (LIORESAL) 10 MG tablet Take 10 mg by mouth 3 (three) times daily. 02/28/20  Yes [provider]  oxyCODONE-acetaminophen (PERCOCET/ROXICET) 5-325 MG tablet Take 1 tablet by mouth 4 (four) times daily as needed. 08/06/19  Yes [provider]  cyclobenzaprine (FLEXERIL) 5 MG tablet as needed.    [provider]  omeprazole (PRILOSEC) 20 MG capsule Take 1 capsule (20 mg total) by mouth daily. 08/13/20   Zigmund Gottron, NP  ondansetron (ZOFRAN-ODT) 4 MG disintegrating tablet Take 1 tablet (4 mg total) by mouth every 8 (eight) hours as needed for nausea or vomiting. 08/13/20   Zigmund Gottron, NP    Family History Family History  Problem Relation Age of Onset  . Healthy Mother   . Hyperlipidemia Father   . Hypertension Father   . Diabetes Father   . Cancer Maternal Grandmother        colon  . Hyperlipidemia Maternal Grandmother   . Other Brother        Died, [employer] in  [place]  . Healthy Sister     Social History Social History   Tobacco Use  . Smoking status: Never Smoker  . Smokeless tobacco: Never Used  Vaping Use  . Vaping Use: Never used  Substance Use Topics  . Alcohol use: Yes    Comment: occasionally   . Drug use: Not Currently    Types: Marijuana     Allergies   Patient has no known allergies.   Review of Systems Review of Systems   Physical Exam Triage Vital Signs ED Triage Vitals  Enc Vitals Group     BP 08/13/20 1359 116/87     Pulse Rate 08/13/20 1359 86     Resp --      Temp 08/13/20 1359 98.7 F  (37.1 C)     Temp Source 08/13/20 1359 Oral     SpO2 --      Weight --      Height --      Head Circumference --      Peak Flow --      Pain Score 08/13/20 1358 0     Pain Loc --      Pain Edu? --      Excl. in [place]? --    No data found.  Updated Vital Signs BP 116/87 (BP Location: Left Arm)   Pulse 86   Temp 98.7 F (37.1 C) (Oral)    Physical Exam Constitutional:      Appearance: He is well-developed.  HENT:     Mouth/Throat:     Mouth: Mucous membranes are moist.  Eyes:     Pupils: Pupils are equal, round, and reactive to light.  Cardiovascular:     Rate and Rhythm: Normal rate.  Pulmonary:     Effort: Pulmonary effort is normal.  Abdominal:     Tenderness: There is no abdominal tenderness. There is no right CVA tenderness, left CVA tenderness or guarding.  Skin:    General: Skin is warm and dry.  Neurological:     Mental Status: He is alert and oriented to person, place, and time.  Psychiatric:     Comments: Rapid speech which is seemingly related to anxiety       UC Treatments / Results  Labs (all labs ordered are listed, but only abnormal results are displayed) Labs Reviewed  POCT URINALYSIS DIPSTICK, ED / UC - Abnormal; Notable for the following components:      Result Value   Bilirubin Urine SMALL (*)    Ketones, ur 15 (*)    Hgb urine dipstick TRACE (*)    All other components within normal limits  SARS CORONAVIRUS 2 (TAT 6-24 HRS)  CBC WITH DIFFERENTIAL/PLATELET  COMPREHENSIVE METABOLIC PANEL  RPR  HIV ANTIBODY (ROUTINE TESTING W REFLEX)  LIPASE, BLOOD  CYTOLOGY, (ORAL, ANAL, URETHRAL) ANCILLARY ONLY    EKG   Radiology No results found.  Procedures Procedures (including critical care time)  Medications Ordered in UC Medications - No data to display  Initial Impression / Assessment and Plan / UC Course  I have reviewed the triage vital signs and the nursing notes.  Pertinent labs & imaging results that were available during my  care of the patient were reviewed by me and considered in my medical decision making (see chart for details).     Vomiting, somewhat intermittently lfor the past 8 days. No specific pain. No further fevers, but with one day of fever. covid testing collected as well as abdominal labs. STD  screening collected and pending. zofran provided. Gastritis considered, discussed alcohol intake. Vitals stable. Supportive cares recommended pending labs. Urine with hgb and small bili. Biliary source of symptoms vs dehydration considered. Return precautions provided. Patient verbalized understanding and agreeable to plan.   Final Clinical Impressions(s) / UC Diagnoses   Final diagnoses:  Non-intractable vomiting with nausea, unspecified vomiting type     Discharge Instructions     Small frequent sips of fluids- Pedialyte, Gatorade, water, broth- to maintain hydration.   Zofran every 8 hours as needed for nausea or vomiting.   Omeprazole daily to prevent and/or treat any stomach ulcers.  We will notify of you any positive findings or if any changes to treatment are needed from your lab tests. If normal or otherwise without concern to your results, we will not call you. Please log on to your MyChart to review your results if interested in so.      ED Prescriptions    Medication Sig Dispense Auth. Provider   ondansetron (ZOFRAN-ODT) 4 MG disintegrating tablet Take 1 tablet (4 mg total) by mouth every 8 (eight) hours as needed for nausea or vomiting. 12 tablet Augusto Gamble B, NP   omeprazole (PRILOSEC) 20 MG capsule Take 1 capsule (20 mg total) by mouth daily. 30 capsule Zigmund Gottron, NP     PDMP not reviewed this encounter.   Zigmund Gottron, NP 08/13/20 1501

## 2020-08-14 LAB — RPR: RPR Ser Ql: NONREACTIVE

## 2020-08-14 LAB — SARS CORONAVIRUS 2 (TAT 6-24 HRS): SARS Coronavirus 2: NEGATIVE

## 2020-08-16 LAB — CYTOLOGY, (ORAL, ANAL, URETHRAL) ANCILLARY ONLY
Chlamydia: NEGATIVE
Comment: NEGATIVE
Comment: NEGATIVE
Comment: NORMAL
Neisseria Gonorrhea: NEGATIVE
Trichomonas: NEGATIVE

## 2020-10-22 ENCOUNTER — Other Ambulatory Visit: Payer: Self-pay

## 2020-10-22 ENCOUNTER — Ambulatory Visit (INDEPENDENT_AMBULATORY_CARE_PROVIDER_SITE_OTHER): Payer: BC Managed Care – PPO | Admitting: Family Medicine

## 2020-10-22 ENCOUNTER — Encounter: Payer: Self-pay | Admitting: Family Medicine

## 2020-10-22 VITALS — BP 142/98 | HR 101 | Ht 72.0 in | Wt 153.0 lb

## 2020-10-22 DIAGNOSIS — K439 Ventral hernia without obstruction or gangrene: Secondary | ICD-10-CM | POA: Diagnosis not present

## 2020-10-22 NOTE — Patient Instructions (Signed)
Hernia, Adult   A hernia is the bulging of an organ or tissue through a weak spot in the muscles of the abdomen (abdominal wall). Hernias develop most often near the belly button (navel) or the area where the leg meets the lower abdomen (groin). Common types of hernias include: Incisional hernia. This type bulges through a scar from an abdominal surgery. Umbilical hernia. This type develops near the navel. Inguinal hernia. This type develops in the groin or scrotum. Femoral hernia. This type develops under the groin, in the upper thigh area. Hiatal hernia. This type occurs when part of the stomach slides above the muscle that separates the abdomen from the chest (diaphragm). What are the causes? This condition may be caused by: Heavy lifting. Coughing over a long period of time. Straining to have a bowel movement. Constipation can lead to straining. An incision made during an abdominal surgery. A physical problem that is present at birth (congenital defect). Being overweight or obese. Smoking. Excess fluid in the abdomen. Undescended testicles in males. What are the signs or symptoms? The main symptom is a skin-colored, rounded bulge in the area of the hernia. However, a bulge may not always be present. It may grow bigger or be more visible when you cough or strain (such as when lifting something heavy). A hernia that can be pushed back into the area (is reducible) rarely causes pain. A hernia that cannot be pushed back into the area (is incarcerated) may lose its blood supply (become strangulated). A hernia that is incarcerated may cause: Pain. Fever. Nausea and vomiting. Swelling. Constipation. How is this diagnosed? A hernia may be diagnosed based on: Your symptoms and medical history. A physical exam. Your health care provider may ask you to cough or move in certain ways to see if the hernia becomes visible. Imaging tests, such as: X-rays. Ultrasound. CT scan. How is this  treated? A hernia that is small and painless may not need to be treated. A hernia that is large or painful may be treated with surgery. Inguinal hernias may be treated with surgery to prevent incarceration or strangulation. Strangulated hernias are always treated with surgery because a lack of blood supply to the trapped organ or tissue can cause it to die. Surgery to treat a hernia involves pushing the bulge back into place and repairing the weak area of the muscle or abdominal wall. Follow these instructions at home: Activity Avoid straining. Do not lift anything that is heavier than 10 lb (4.5 kg), or the limit that you are told, until your health care provider says that it is safe. When lifting heavy objects, lift with your leg muscles, not your back muscles. Preventing constipation Take actions to prevent constipation. Constipation leads to straining with bowel movements, which can make a hernia worse or cause a hernia repair to break down. Your health care provider may recommend that you: Drink enough fluid to keep your urine pale yellow. Eat foods that are high in fiber, such as fresh fruits and vegetables, whole grains, and beans. Limit foods that are high in fat and processed sugars, such as fried or sweet foods. Take an over-the-counter or prescription medicine for constipation. General instructions When coughing, try to cough gently. You may try to push the hernia back in place by very gently pressing on it while lying down. Do not try to force the bulge back in if it will not push in easily. If you are overweight, work with your health care provider to lose   weight safely. Do not use any products that contain nicotine or tobacco, such as cigarettes and e-cigarettes. If you need help quitting, ask your health care provider. If you are scheduled for hernia repair, watch your hernia for any changes in shape, size, or color. Tell your health care provider about any changes or new  symptoms. Take over-the-counter and prescription medicines only as told by your health care provider. Keep all follow-up visits as told by your health care provider. This is important. Contact a health care provider if: You develop new pain, swelling, or redness around your hernia. You have signs of constipation, such as: Fewer bowel movements in a week than normal. Difficulty having a bowel movement. Stools that are dry, hard, or larger than normal. Get help right away if: You have a fever. You have abdomen pain that gets worse. You feel nauseous or you vomit. You cannot push the hernia back in place by very gently pressing on it while lying down. Do not try to force the bulge back in if it will not push in easily. The hernia: Changes in shape, size, or color. Feels hard or tender. These symptoms may represent a serious problem that is an emergency. Do not wait to see if the symptoms will go away. Get medical help right away. Call your local emergency services (911 in the [place] Summary A hernia is the bulging of an organ or tissue through a weak spot in the muscles of the abdomen (abdominal wall). The main symptom is a skin-colored, rounded lump (bulge) in the hernia area. However, a bulge may not always be present. It may grow bigger or more visible when you cough or strain (such as when having a bowel movement). A hernia that is small and painless may not need to be treated. A hernia that is large or painful may be treated with surgery. Surgery to treat a hernia involves pushing the bulge back into place and repairing the weak part of the abdomen. This information is not intended to replace advice given to you by your health care provider. Make sure you discuss any questions you have with your health care provider. Document Revised: 12/19/2018 Document Reviewed: 05/30/2017 Elsevier Patient Education  2021 Elsevier Inc.  

## 2020-10-22 NOTE — Progress Notes (Signed)
Established Patient Office Visit  Subjective:  Patient ID: Luke Mcgrath, male    DOB: Oct 08, 1985  Age: 35 y.o. MRN: [id number]  CC:  Chief Complaint  Patient presents with  . Hernia    HPI Luke Mcgrath presents for possible small hernia right lower abdominal wall.  Luke Mcgrath noticed bulge in this area worse with lifting about 3 weeks ago.  No associated pain.  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  No prior history of hernia.  Luke Mcgrath is also dealing with carpal tunnel syndrome symptoms.  Tried night splinting without much improvement.  Luke Mcgrath has frequent numbness and tingling in his fingers especially the first 3 of both hands.  Past Medical History:  Diagnosis Date  . Anxiety   . Cavernous malformation 09/2013   Cervical region    Past Surgical History:  Procedure Laterality Date  . BRAIN SURGERY  1990   age 83  . CERVICAL LAMINECTOMY FOR EXCISION OF NEOPLASM  2015   Cervical cavernoma at C6-7  . RECONSTRUCTION OF NOSE  1994    Family History  Problem Relation Age of Onset  . Healthy Mother   . Hyperlipidemia Father   . Hypertension Father   . Diabetes Father   . Cancer Maternal Grandmother        colon  . Hyperlipidemia Maternal Grandmother   . Other Brother        Died, [employer] in [place]  . Healthy Sister     Social History   Socioeconomic History  . Marital status: Legally Separated    Spouse name: Not on file  . Number of children: Not on file  . Years of education: Not on file  . Highest education level: Not on file  Occupational History  . Not on file  Tobacco Use  . Smoking status: Never Smoker  . Smokeless tobacco: Never Used  Vaping Use  . Vaping Use: Never used  Substance and Sexual Activity  . Alcohol use: Yes    Comment: occasionally   . Drug use: Not Currently    Types: Marijuana  . Sexual activity: Not on file  Other Topics Concern  . Not on file  Social  History Narrative   Lives with wife and two children.   Luke Mcgrath works for an [employer].       Social Determinants of Health   Financial Resource Strain: Not on file  Food Insecurity: Not on file  Transportation Needs: Not on file  Physical Activity: Not on file  Stress: Not on file  Social Connections: Not on file  Intimate Partner Violence: Not on file    Outpatient Medications Prior to Visit  Medication Sig Dispense Refill  . baclofen (LIORESAL) 10 MG tablet Take 10 mg by mouth 3 (three) times daily.    . cyclobenzaprine (FLEXERIL) 5 MG tablet as needed.    Marland Kitchen omeprazole (PRILOSEC) 20 MG capsule Take 1 capsule (20 mg total) by mouth daily. 30 capsule 0  . ondansetron (ZOFRAN-ODT) 4 MG disintegrating tablet Take 1 tablet (4 mg total) by mouth every 8 (eight) hours as needed for nausea or vomiting. 12 tablet 0  . oxyCODONE-acetaminophen (PERCOCET/ROXICET) 5-325 MG tablet Take 1 tablet by mouth 4 (four) times daily as needed.     No facility-administered medications prior to visit.    No Known Allergies  ROS Review of Systems  Gastrointestinal: Negative for abdominal pain, nausea and  vomiting.      Objective:    Physical Exam Vitals reviewed.  Constitutional:      Appearance: Normal appearance.  Cardiovascular:     Rate and Rhythm: Normal rate and regular rhythm.  Pulmonary:     Effort: Pulmonary effort is normal.     Breath sounds: Normal breath sounds.  Abdominal:     Palpations: Abdomen is soft.     Comments: Luke Mcgrath does have a very small bulge right lower abdominal region.  This is just above the inguinal region.  Soft and nontender.  Easily reducible.  Does appear to have a very small palpable defect in the support tissue in that region  Neurological:     Mental Status: Luke Mcgrath is alert.     BP (!) 142/98   Pulse (!) 101   Ht 6' (1.829 m)   Wt 153 lb (69.4 kg)   SpO2 97%   BMI 20.75 kg/m  Wt Readings from Last 3 Encounters:  10/22/20 153 lb (69.4 kg)   04/17/14 178 lb 9 oz (81 kg)  03/30/14 175 lb (79.4 kg)     Health Maintenance Due  Topic Date Due  . Hepatitis C Screening  Never done  . COVID-19 Vaccine (1) Never done  . INFLUENZA VACCINE  04/11/2020    There are no preventive care reminders to display for this patient.  Lab Results  Component Value Date   TSH 0.92 09/21/2016   Lab Results  Component Value Date   WBC 4.1 08/13/2020   HGB 15.4 08/13/2020   HCT 44.7 08/13/2020   MCV 86.0 08/13/2020   PLT 277 08/13/2020   Lab Results  Component Value Date   NA 136 08/13/2020   K 3.4 (L) 08/13/2020   CO2 23 08/13/2020   GLUCOSE 115 (H) 08/13/2020   BUN 10 08/13/2020   CREATININE 0.98 08/13/2020   BILITOT 0.7 08/13/2020   ALKPHOS 57 08/13/2020   AST 19 08/13/2020   ALT 14 08/13/2020   PROT 6.6 08/13/2020   ALBUMIN 3.8 08/13/2020   CALCIUM 9.0 08/13/2020   ANIONGAP 15 08/13/2020   GFR 95.04 09/21/2016   Lab Results  Component Value Date   CHOL 203 (H) 09/21/2016   Lab Results  Component Value Date   HDL 54.20 09/21/2016   Lab Results  Component Value Date   LDLCALC 124 (H) 09/21/2016   Lab Results  Component Value Date   TRIG 122.0 09/21/2016   Lab Results  Component Value Date   CHOLHDL 4 09/21/2016   No results found for: HGBA1C    Assessment & Plan:   Probable small abdominal wall hernia.  Currently asymptomatic and easily reducible.  -We discussed possible referral to general surgeon to discuss pros and cons of repair.  His main concern is that Luke Mcgrath does very physical work.  Luke Mcgrath wishes to observe for now but will be in touch if Luke Mcgrath decides to check with surgeon regarding possible repair options.  Reviewed signs and symptoms of strangulation.  Patient's main concern is being out of work for extended period to get this repaired.   No orders of the defined types were placed in this encounter.   Follow-up: No follow-ups on file.    Carolann Littler, MD

## 2020-11-01 ENCOUNTER — Telehealth: Payer: Self-pay | Admitting: Family Medicine

## 2020-11-01 DIAGNOSIS — K439 Ventral hernia without obstruction or gangrene: Secondary | ICD-10-CM

## 2020-11-01 NOTE — Telephone Encounter (Signed)
Pts girlfriend called and stated that the pt would like to move forward with a referral to have the hernia surgery.

## 2020-11-01 NOTE — Telephone Encounter (Signed)
Referral placed.

## 2020-11-02 ENCOUNTER — Telehealth: Payer: Self-pay | Admitting: Family Medicine

## 2020-11-02 NOTE — Telephone Encounter (Signed)
Referral looks like has already been placed to [facility] surgery.  Their office should be calling patient.

## 2020-11-02 NOTE — Telephone Encounter (Signed)
Patient is calling and wanted to let provider know that he does want to have surgery to remove hernia, please advise. CB is [phone]

## 2020-11-02 NOTE — Telephone Encounter (Signed)
Patient was informed of the message below.

## 2021-03-10 ENCOUNTER — Encounter (HOSPITAL_COMMUNITY): Payer: Self-pay

## 2021-03-10 ENCOUNTER — Other Ambulatory Visit: Payer: Self-pay

## 2021-03-10 ENCOUNTER — Ambulatory Visit (HOSPITAL_COMMUNITY)
Admission: EM | Admit: 2021-03-10 | Discharge: 2021-03-10 | Disposition: A | Discharge status: Home / Self Care | Payer: BC Managed Care – PPO

## 2021-03-10 DIAGNOSIS — M5410 Radiculopathy, site unspecified: Secondary | ICD-10-CM

## 2021-03-10 DIAGNOSIS — S161XXA Strain of muscle, fascia and tendon at neck level, initial encounter: Secondary | ICD-10-CM

## 2021-03-10 DIAGNOSIS — S39012A Strain of muscle, fascia and tendon of lower back, initial encounter: Secondary | ICD-10-CM

## 2021-03-10 MED ORDER — KETOROLAC TROMETHAMINE 60 MG/2ML IM SOLN
60.0000 mg | Freq: Once | INTRAMUSCULAR | Status: AC
  Administered 2021-03-10: 60 mg via INTRAMUSCULAR

## 2021-03-10 MED ORDER — KETOROLAC TROMETHAMINE 60 MG/2ML IM SOLN
INTRAMUSCULAR | Status: AC
  Filled 2021-03-10: qty 2

## 2021-03-10 MED ORDER — PREDNISONE 20 MG PO TABS: 40.0000 mg | ORAL_TABLET | Freq: Every day | ORAL | 0 refills | Status: AC

## 2021-03-10 NOTE — ED Triage Notes (Signed)
Pt with pain in right upper back going up into neck, has been seen at chiropractor with temporary relief.

## 2021-03-10 NOTE — Discharge Instructions (Addendum)
Take the prednisone daily for the next 5 days.  Take 2 benadryl tonight to help with pain and sleep.    You can use heat, ice, or alternate between heat and ice.   Rest as much as possible.   You can also try Icy/Hot, Voltaren, or other pain cream.    Follow up with orthopedics if your symptoms do not improve.

## 2021-03-10 NOTE — ED Provider Notes (Signed)
[facility]    CSN: [id number] Arrival date & time: 03/10/21  1753      History   Chief Complaint Chief Complaint  Patient presents with   Back Pain   Neck Pain    HPI Luke Mcgrath is a 35 y.o. male.   Patient here for evaluation of right upper back and neck pain that has been ongoing for the past several days.  Reports having physical job and believes he strained something while working.  Reports having massage and seeing a chiropractor twice which has only helped to relieve pain temporarily.  Reports taking some OTC medications with minimal relief.  Denies any trauma, injury, or other precipitating event.  Denies any specific alleviating or aggravating factors.  Denies any fevers, chest pain, shortness of breath, N/V/D, numbness, tingling, weakness, abdominal pain, or headaches.     The history is provided by the patient.  Back Pain Neck Pain  Past Medical History:  Diagnosis Date   Anxiety    Cavernous malformation 09/2013   Cervical region    Patient Active Problem List   Diagnosis Date Noted   Carpal tunnel syndrome of left wrist 01/22/2018   Carpal tunnel syndrome of right wrist 01/22/2018   Prurigo nodularis 06/18/2017   GERD (gastroesophageal reflux disease) 10/06/2013   Arteriovenous fistula of spinal cord vessels 08/01/2013   Neuralgia of left thigh 07/01/2013   Chronic anxiety 07/01/2013    Past Surgical History:  Procedure Laterality Date   BRAIN SURGERY  1990   age 64   CERVICAL LAMINECTOMY FOR EXCISION OF NEOPLASM  2015   Cervical cavernoma at C6-7   RECONSTRUCTION OF NOSE  1994       Home Medications    Prior to Admission medications   Medication Sig Start Date End Date Taking? Authorizing Provider  baclofen (LIORESAL) 10 MG tablet Take 10 mg by mouth 3 (three) times daily. 02/28/20  Yes [provider]  gabapentin (NEURONTIN) 300 MG capsule Take 300 mg by mouth daily.   Yes [provider]  ondansetron  (ZOFRAN-ODT) 4 MG disintegrating tablet Take 1 tablet (4 mg total) by mouth every 8 (eight) hours as needed for nausea or vomiting. 08/13/20  Yes Burky, Lanelle Bal B, NP  oxyCODONE-acetaminophen (PERCOCET/ROXICET) 5-325 MG tablet Take 1 tablet by mouth 4 (four) times daily as needed. 08/06/19  Yes [provider]  predniSONE (DELTASONE) 20 MG tablet Take 2 tablets (40 mg total) by mouth daily for 5 days. 03/10/21 03/15/21 Yes Pearson Forster, NP  cyclobenzaprine (FLEXERIL) 5 MG tablet as needed.    [provider]  omeprazole (PRILOSEC) 20 MG capsule Take 1 capsule (20 mg total) by mouth daily. 08/13/20   Zigmund Gottron, NP    Family History Family History  Problem Relation Age of Onset   Healthy Mother    Hyperlipidemia Father    Hypertension Father    Diabetes Father    Cancer Maternal Grandmother        colon   Hyperlipidemia Maternal Grandmother    Other Brother        Died, [employer] services in [place]   Healthy Sister     Social History Social History   Tobacco Use   Smoking status: Never   Smokeless tobacco: Never  Vaping Use   Vaping Use: Never used  Substance Use Topics   Alcohol use: Yes    Comment: occasionally    Drug use: Not Currently    Types: Marijuana  Allergies   Patient has no known allergies.   Review of Systems Review of Systems  Musculoskeletal:  Positive for back pain and neck pain.  All other systems reviewed and are negative.   Physical Exam Triage Vital Signs ED Triage Vitals  Enc Vitals Group     BP 03/10/21 1843 (!) 147/98     Pulse Rate 03/10/21 1843 69     Resp 03/10/21 1843 18     Temp 03/10/21 1843 98.6 F (37 C)     Temp src --      SpO2 03/10/21 1843 98 %     Weight --      Height --      Head Circumference --      Peak Flow --      Pain Score 03/10/21 1840 8     Pain Loc --      Pain Edu? --      Excl. in [place]? --    No data found.  Updated Vital Signs BP (!) 147/98   Pulse 69   Temp 98.6 F (37 C)    Resp 18   SpO2 98%   Visual Acuity Right Eye Distance:   Left Eye Distance:   Bilateral Distance:    Right Eye Near:   Left Eye Near:    Bilateral Near:     Physical Exam Vitals and nursing note reviewed.  Constitutional:      General: He is not in acute distress.    Appearance: Normal appearance. He is not ill-appearing, toxic-appearing or diaphoretic.  HENT:     Head: Normocephalic and atraumatic.  Eyes:     Conjunctiva/sclera: Conjunctivae normal.  Cardiovascular:     Rate and Rhythm: Normal rate.     Pulses: Normal pulses.  Pulmonary:     Effort: Pulmonary effort is normal.  Abdominal:     General: Abdomen is flat.  Musculoskeletal:     Cervical back: Spasms and tenderness present. No swelling, edema, erythema, rigidity or crepitus. No pain with movement. Decreased range of motion (due to pain).     Thoracic back: Spasms and tenderness present. No swelling, deformity or bony tenderness. Decreased range of motion (due to pain).  Skin:    General: Skin is warm and dry.  Neurological:     General: No focal deficit present.     Mental Status: He is alert and oriented to person, place, and time.  Psychiatric:        Mood and Affect: Mood normal.     UC Treatments / Results  Labs (all labs ordered are listed, but only abnormal results are displayed) Labs Reviewed - No data to display  EKG   Radiology No results found.  Procedures Procedures (including critical care time)  Medications Ordered in UC Medications  ketorolac (TORADOL) injection 60 mg (60 mg Intramuscular Given 03/10/21 1911)    Initial Impression / Assessment and Plan / UC Course  I have reviewed the triage vital signs and the nursing notes.  Pertinent labs & imaging results that were available during my care of the patient were reviewed by me and considered in my medical decision making (see chart for details).    Assessment negative for red flags or concerns.  This is likely neck and  lumbar strain.  Will treat with ketorolac IM administered in office and then prednisone daily for the next 5 days.  Patient encouraged to take 2 Benadryl tonight to help with pain and sleep.  Recommend heat,  ice, or alternate between heat and ice for comfort.  Encourage rest and fluids.  May try OTC pain creams.  Follow-up with orthopedics or primary care if symptoms do not improve in the next few days. Final Clinical Impressions(s) / UC Diagnoses   Final diagnoses:  Strain of neck muscle, initial encounter  Strain of lumbar region, initial encounter     Discharge Instructions      Take the prednisone daily for the next 5 days.  Take 2 benadryl tonight to help with pain and sleep.    You can use heat, ice, or alternate between heat and ice.   Rest as much as possible.   You can also try Icy/Hot, Voltaren, or other pain cream.    Follow up with orthopedics if your symptoms do not improve.       ED Prescriptions     Medication Sig Dispense Auth. Provider   predniSONE (DELTASONE) 20 MG tablet Take 2 tablets (40 mg total) by mouth daily for 5 days. 10 tablet Pearson Forster, NP      PDMP not reviewed this encounter.   Pearson Forster, NP 03/10/21 1921

## 2021-03-11 ENCOUNTER — Encounter: Payer: Self-pay | Admitting: Family Medicine

## 2021-03-11 ENCOUNTER — Ambulatory Visit (INDEPENDENT_AMBULATORY_CARE_PROVIDER_SITE_OTHER): Payer: BC Managed Care – PPO | Admitting: Family Medicine

## 2021-03-11 VITALS — BP 130/70 | HR 63 | Temp 98.3°F | Wt 165.9 lb

## 2021-03-11 DIAGNOSIS — M546 Pain in thoracic spine: Secondary | ICD-10-CM

## 2021-03-11 MED ORDER — CYCLOBENZAPRINE HCL 10 MG PO TABS: 10.0000 mg | ORAL_TABLET | Freq: Three times a day (TID) | ORAL | 0 refills | Status: DC | PRN

## 2021-03-11 NOTE — Progress Notes (Signed)
Established Patient Office Visit  Subjective:  Patient ID: Luke Mcgrath, male    DOB: 09/19/1985  Age: 35 y.o. MRN: [id number]  CC:  Chief Complaint  Patient presents with   Shoulder Pain    R shoulder and neck pain, x 1 week, pt had a massage and the day after started having sharp pains, thinks it may be a pinched nerve    HPI Luke Mcgrath presents for right upper thoracic pain.  Onset about a week ago.  He states he had a massage and a few days later states he woke up with a "crick "in his neck.  He went to chiropractor this past Tuesday and felt slightly better after treatment temporarily.  Had recurrent symptoms and went back and had x-rays which were reportedly "okay ".  He has been using some ice with some relief.  He went to urgent care and had intramuscular steroids along with oral prednisone taper.  He thinks this may have helped only minimally.  His job is very physical with installing elevators.  Denies any specific injury.  Has been taking some leftover baclofen without improvement.  Feels muscles of tighten up.  He does have history of arteriovenous fistula spinal cord vessels back in 2015 and had surgery at [facility] for that.  Most recent MRI cervical spine 2019 which showed no new abnormalities.  Previous intramedullary lesion cervical cord at C7 unchanged.  His current pain is inferior to this in the upper thoracic area and lateral to the spine on the right side.  No upper extremity symptoms.  Past Medical History:  Diagnosis Date   Anxiety    Cavernous malformation 09/2013   Cervical region    Past Surgical History:  Procedure Laterality Date   BRAIN SURGERY  1990   age 32   CERVICAL LAMINECTOMY FOR EXCISION OF NEOPLASM  2015   Cervical cavernoma at C6-7   RECONSTRUCTION OF NOSE  1994    Family History  Problem Relation Age of Onset   Healthy Mother    Hyperlipidemia Father    Hypertension Father    Diabetes Father    Cancer Maternal Grandmother         colon   Hyperlipidemia Maternal Grandmother    Other Brother        Died, Nature conservation officer services in [place]   Healthy Sister     Social History   Socioeconomic History   Marital status: Legally Separated    Spouse name: Not on file   Number of children: Not on file   Years of education: Not on file   Highest education level: Not on file  Occupational History   Not on file  Tobacco Use   Smoking status: Never   Smokeless tobacco: Never  Vaping Use   Vaping Use: Never used  Substance and Sexual Activity   Alcohol use: Yes    Comment: occasionally    Drug use: Not Currently    Types: Marijuana   Sexual activity: Not on file  Other Topics Concern   Not on file  Social History Narrative   Lives with wife and two children.   He works for an [employer].       Social Determinants of Health   Financial Resource Strain: Not on file  Food Insecurity: Not on file  Transportation Needs: Not on file  Physical Activity: Not on file  Stress: Not on file  Social Connections: Not on file  Intimate Partner Violence: Not on  file    Outpatient Medications Prior to Visit  Medication Sig Dispense Refill   baclofen (LIORESAL) 10 MG tablet Take 10 mg by mouth 3 (three) times daily.     gabapentin (NEURONTIN) 300 MG capsule Take 300 mg by mouth daily.     omeprazole (PRILOSEC) 20 MG capsule Take 1 capsule (20 mg total) by mouth daily. 30 capsule 0   ondansetron (ZOFRAN-ODT) 4 MG disintegrating tablet Take 1 tablet (4 mg total) by mouth every 8 (eight) hours as needed for nausea or vomiting. 12 tablet 0   oxyCODONE-acetaminophen (PERCOCET/ROXICET) 5-325 MG tablet Take 1 tablet by mouth 4 (four) times daily as needed.     predniSONE (DELTASONE) 20 MG tablet Take 2 tablets (40 mg total) by mouth daily for 5 days. 10 tablet 0   cyclobenzaprine (FLEXERIL) 5 MG tablet as needed.     No facility-administered medications prior to visit.    No Known Allergies  ROS Review of Systems   Constitutional:  Negative for chills and fever.  Respiratory:  Negative for shortness of breath.   Cardiovascular:  Negative for chest pain.  Neurological:  Negative for weakness, numbness and headaches.     Objective:    Physical Exam Vitals reviewed.  Constitutional:      Appearance: Normal appearance.  Cardiovascular:     Rate and Rhythm: Normal rate and regular rhythm.  Pulmonary:     Effort: Pulmonary effort is normal.     Breath sounds: Normal breath sounds.  Musculoskeletal:     Comments: Long vertical scar cervical region from prior surgery.  His current pain is in upper thoracic area on the right side.  No localized tenderness.  No spinal tenderness.  No visible swelling.  No erythema.  Neurological:     Mental Status: He is alert.    BP 130/70 (BP Location: Left Arm, Patient Position: Sitting, Cuff Size: Normal)   Pulse 63   Temp 98.3 F (36.8 C) (Oral)   Wt 165 lb 14.4 oz (75.3 kg)   SpO2 97%   BMI 22.50 kg/m  Wt Readings from Last 3 Encounters:  03/11/21 165 lb 14.4 oz (75.3 kg)  10/22/20 153 lb (69.4 kg)  04/17/14 178 lb 9 oz (81 kg)     Health Maintenance Due  Topic Date Due   COVID-19 Vaccine (1) Never done   Hepatitis C Screening  Never done    There are no preventive care reminders to display for this patient.  Lab Results  Component Value Date   TSH 0.92 09/21/2016   Lab Results  Component Value Date   WBC 4.1 08/13/2020   HGB 15.4 08/13/2020   HCT 44.7 08/13/2020   MCV 86.0 08/13/2020   PLT 277 08/13/2020   Lab Results  Component Value Date   NA 136 08/13/2020   K 3.4 (L) 08/13/2020   CO2 23 08/13/2020   GLUCOSE 115 (H) 08/13/2020   BUN 10 08/13/2020   CREATININE 0.98 08/13/2020   BILITOT 0.7 08/13/2020   ALKPHOS 57 08/13/2020   AST 19 08/13/2020   ALT 14 08/13/2020   PROT 6.6 08/13/2020   ALBUMIN 3.8 08/13/2020   CALCIUM 9.0 08/13/2020   ANIONGAP 15 08/13/2020   GFR 95.04 09/21/2016   Lab Results  Component Value Date    CHOL 203 (H) 09/21/2016   Lab Results  Component Value Date   HDL 54.20 09/21/2016   Lab Results  Component Value Date   LDLCALC 124 (H) 09/21/2016   Lab  Results  Component Value Date   TRIG 122.0 09/21/2016   Lab Results  Component Value Date   CHOLHDL 4 09/21/2016   No results found for: HGBA1C    Assessment & Plan:   Acute pain right upper thoracic back region.  This sounds musculoskeletal.  Reportedly had plain x-rays per her chiropractor recently which were unremarkable.  Prior history of AV fistula spinal cord vessels as above.  His current pain is below that region and more lateral.  -Patient already prescribed prednisone. -We recommend trial of Flexeril 10 mg every 8 hours as needed with caution about sedation -Continue heat or ice for symptom relief -Touch base if symptoms not improving by next week  Meds ordered this encounter  Medications   cyclobenzaprine (FLEXERIL) 10 MG tablet    Sig: Take 1 tablet (10 mg total) by mouth 3 (three) times daily as needed for muscle spasms.    Dispense:  30 tablet    Refill:  0    Follow-up: No follow-ups on file.    Carolann Littler, MD

## 2021-03-11 NOTE — Patient Instructions (Signed)
Continue with icing 20 minutes every 4-5 hours as needed  Caution with the muscle relaxer secondary to sedation.   Let me know by early next week if not improving.

## 2021-03-16 ENCOUNTER — Encounter: Payer: Self-pay | Admitting: Surgery

## 2021-03-16 ENCOUNTER — Ambulatory Visit (INDEPENDENT_AMBULATORY_CARE_PROVIDER_SITE_OTHER): Payer: BC Managed Care – PPO | Admitting: Surgery

## 2021-03-16 ENCOUNTER — Ambulatory Visit: Payer: Self-pay

## 2021-03-16 VITALS — BP 131/96 | HR 75 | Ht 72.0 in | Wt 165.9 lb

## 2021-03-16 DIAGNOSIS — M5412 Radiculopathy, cervical region: Secondary | ICD-10-CM

## 2021-03-16 DIAGNOSIS — M542 Cervicalgia: Secondary | ICD-10-CM | POA: Diagnosis not present

## 2021-03-16 DIAGNOSIS — R2681 Unsteadiness on feet: Secondary | ICD-10-CM

## 2021-03-16 DIAGNOSIS — D1800 Hemangioma unspecified site: Secondary | ICD-10-CM

## 2021-03-17 ENCOUNTER — Ambulatory Visit
Admission: RE | Admit: 2021-03-17 | Discharge: 2021-03-17 | Disposition: A | Discharge status: Home / Self Care | Payer: BC Managed Care – PPO | Source: Ambulatory Visit | Attending: Surgery | Admitting: Surgery

## 2021-03-17 DIAGNOSIS — M5412 Radiculopathy, cervical region: Secondary | ICD-10-CM

## 2021-03-17 DIAGNOSIS — R2681 Unsteadiness on feet: Secondary | ICD-10-CM

## 2021-03-17 DIAGNOSIS — M542 Cervicalgia: Secondary | ICD-10-CM

## 2021-03-21 ENCOUNTER — Other Ambulatory Visit: Payer: Self-pay

## 2021-03-21 ENCOUNTER — Encounter: Payer: Self-pay | Admitting: Orthopaedic Surgery

## 2021-03-21 ENCOUNTER — Ambulatory Visit (INDEPENDENT_AMBULATORY_CARE_PROVIDER_SITE_OTHER): Payer: BC Managed Care – PPO | Admitting: Orthopaedic Surgery

## 2021-03-21 DIAGNOSIS — M5020 Other cervical disc displacement, unspecified cervical region: Secondary | ICD-10-CM

## 2021-03-21 NOTE — Progress Notes (Addendum)
Office Visit Note   Patient: Luke Mcgrath           Date of Birth: 16-Nov-1985           MRN: [id number] Visit Date: 03/16/2021              Requested by: Eulas Post, MD [place],  [place] [id number] PCP: Eulas Post, MD   Assessment & Plan: Visit Diagnoses:  1. Neck pain   2. Radiculopathy, cervical region   3. Unsteady gait   4. Cavernoma     Plan: With the severe pain the patient is describing and history of cervical cavernoma we will get stat cervical MRI and have this compared to the previous study that was done to see if there is any change in the cavernoma and rule out HNP/stenosis.  I will have patient follow-up with Dr. Lorin Mercy in 2 weeks for recheck to discuss results.  I advised patient that ultimately it may come down to refer him back to [facility] to the physician had did his previous surgery in 2015.  During the visit patient was very argumentative multiple times and during the second half of the visit I did bring in my clinical assistant.  He understands that he will follow-up with Dr. Lorin Mercy to review his study.  Follow-Up Instructions: Return in about 2 weeks (around 03/30/2021) for with dr yates.   Orders:  Orders Placed This Encounter  Procedures   XR Cervical Spine 2 or 3 views   MR Cervical Spine w/o contrast   No orders of the defined types were placed in this encounter.     Procedures: No procedures performed   Clinical Data: No additional findings.   Subjective: Chief Complaint  Patient presents with   Neck - Pain    HPI 35 year old white male comes in today with complaints of worsening neck pain, right shoulder pain and scapular pain.  I reviewed patient's chart before seeing him in great detail before seeing him.  Patient has a history of C6-7 cavernoma and underwent C6-7 laminectomy for spinal cavernoma at [facility] October 10, 2013.  He had been followed by neurologist Dr. Posey Pronto here in town before that  surgery.  He was seen again by Dr. Posey Pronto February 2019 and had a repeat MRI November 30, 2017 which showed:  CLINICAL DATA:  New onset bilateral arm numbness and tingling, worse on the left. Known cervical cord lesion by prior MRI. No recent injury.   EXAM: MRI CERVICAL SPINE WITHOUT AND WITH CONTRAST   TECHNIQUE: Multiplanar and multiecho pulse sequences of the cervical spine, to include the craniocervical junction and cervicothoracic junction, were obtained without and with intravenous contrast.   CONTRAST:  15 ml MULTIHANCE GADOBENATE DIMEGLUMINE 529 MG/ML IV SOLN   COMPARISON:  MRI cervical spine 07/22/2013 and 11/30/2016.   FINDINGS: Alignment: Maintained with straightening of lordosis unchanged.   Vertebrae: No fracture.  Marrow signal is unremarkable.   Cord: Again seen is a central and eccentric to the right lesion within the cervical cord centered at the C7 level which demonstrates heterogeneous signal and blooming. The lesion demonstrates little to no contrast enhancement. It is unchanged in size measuring approximately 1.6 cm craniocaudal extending from approximately the C6-7 level to mid T1. The lesion demonstrates little to no enhancement and there is no cord edema.   Posterior Fossa, vertebral arteries, paraspinal tissues: Negative.   Disc levels:   C2-3: Minimal bulge without stenosis, unchanged.  C3-4: Negative.   C4-5: Negative.   C5-6: Mild uncovertebral disease on the right. The central canal and foramina are widely patent.   C6-7: Negative.   C7-T1: Negative.   IMPRESSION: No new abnormality since the prior examination. Intramedullary lesion in the cervical cord at the C7 level is unchanged. Solitary cavernous malformation remains the most likely diagnosis.   Minimal degenerative change without central canal or foraminal stenosis.     Electronically Signed   By: Inge Rise M.D.   On: 11/30/2017 10:38   Physician at [facility]  read the images that were done November 30, 2017 and gave their interpretation December 21, 2017 which is below:  INDICATIONS: Q28.8 Other specified congenital malformations of circulatory  system   COMPARISONS: MRI spine angiogram 09/13/2013.   TECHNIQUE:  - Patient full name: Luke Mcgrath  - Date of Original Study: 11/30/2017  - Study: MRI cervical spine with and without contrast.  - Study Technique: Multiplanar multisequence pre and postcontrast images of  the cervical spine.    FINDINGS:  Postsurgical changes posteriorly at the level of C6-7. There is a T2  hypointense/T1 isointense lesion in the right spinal cord predominantly at  the level of C7. T2 hypointensity extending inferiorly, likely from  hemosiderin deposition. There are a few foci of T2 hyperintensity  internally. There is mild associated cervical cord expansion at the level  of C7 without stenosis. The lesion measures 10 x 4 x 12 mm (TR x AP x CC),  previously 8 x 4 x 10 mm when remeasured similarly. There is minimal wispy  contrast enhancement.   Alignment: Normal cervical spine alignment and craniocervical junction.  Spinal Cord: The visualized cord is normal in caliber and signal. No  abnormal enhancement.  Marrow Signal: Normal.  Epidural Hematoma: None.  Vertebral Body Heights: Normal.  Intervertebral Discs: Normal.   Paraspinal Soft Tissues: Unremarkable.  Neck Soft Tissues: Unremarkable.    IMPRESSION:  Redemonstrated cervical spinal cord lesion at the level of C7 compatible  with cavernoma. This lesion is minimally enlarged and demonstrates greater  T2 hypointensity, likely from repeated microhemorrhage.    Please note: Our interpretation of studies performed at an outside  institution is limited by factors including absence of technical specifics  of the image, undisclosed clinical information and the unavailability of  the original interpretation.  Specialists at the institution that performed  this  study may have access to information not available to [place] that could  make a difference in the interpretation.  We suggest that you obtain the  original interpretation from the site where the study was performed.   Electronically Reviewed by:  Belia Heman, MD, [facility]  Electronically Reviewed on:  12/21/2017 4:23 PM   I have reviewed the images and concur with the above findings.   Electronically Signed by:  Georga Bora, MD, [facility]  Electronically Signed on:  12/21/2017 4:42 PM    Patient states that he has had a "pinched nerve" in his neck since June 26.  Pain radiates to the right side of his neck into the right shoulder and scapula.  He has been to a chiropractor for adjustments.  Was seen at the urgent care March 10, 2021 and was prescribed prednisone taper.  Is also taken ibuprofen, gabapentin and Flexeril without any relief.  When I had initially come in the room and advised patient that I reviewed his chart regarding the cervical cavernoma he spoke multiple times stating that that was not causing  any of his current symptoms.  Advised me that he had not been in contact with his neurosurgeon at [facility] or been in contact with the neurologist.  After long visit with patient he did state that he has been having some chronic balance issues.  Also describes having a "rabbit foot thing" but stated that he was not sure which foot it was.  Review of Systems No current complaints of cardiac pulmonary GI GU issues  Objective: Vital Signs: BP (!) 131/96   Pulse 75   Ht 6' (1.829 m)   Wt 165 lb 14.4 oz (75.3 kg)   BMI 22.50 kg/m   Physical Exam Constitutional:      General: He is not in acute distress.    Comments: Patient with flight of ideas and appears very agitated.  During the second half of my visit I did asked my clinical assistant to come in as a witness due to patient's behavior.  HENT:     Head: Normocephalic and atraumatic.  Eyes:     Extraocular Movements:  Extraocular movements intact.  Pulmonary:     Effort: No respiratory distress.  Musculoskeletal:     Comments: Gait is somewhat unsteady when I have him ambulate in the room and somewhat unbalanced with heel and toe gait.  Positive bilateral brachial plexus trapezius and scapular tenderness.  Both shoulders negative impingement test.  Trace right biceps weakness.  Positive left biceps hyperreflexia.  Positive bilateral patellar DTR hyperreflexia.  Positive right foot clonus.  Trace bilateral hip flexor weakness.  Psychiatric:     Comments: Agitated and flight of ideas    Ortho Exam  Specialty Comments:  No specialty comments available.  Imaging: No results found.   PMFS History: Patient Active Problem List   Diagnosis Date Noted   Carpal tunnel syndrome of left wrist 01/22/2018   Carpal tunnel syndrome of right wrist 01/22/2018   Prurigo nodularis 06/18/2017   GERD (gastroesophageal reflux disease) 10/06/2013   Arteriovenous fistula of spinal cord vessels 08/01/2013   Neuralgia of left thigh 07/01/2013   Chronic anxiety 07/01/2013   Past Medical History:  Diagnosis Date   Anxiety    Cavernous malformation 09/2013   Cervical region    Family History  Problem Relation Age of Onset   Healthy Mother    Hyperlipidemia Father    Hypertension Father    Diabetes Father    Cancer Maternal Grandmother        colon   Hyperlipidemia Maternal Grandmother    Other Brother        Died, Nature conservation officer services in [place]   Healthy Sister     Past Surgical History:  Procedure Laterality Date   BRAIN SURGERY  1990   age 89   CERVICAL LAMINECTOMY FOR EXCISION OF NEOPLASM  2015   Cervical cavernoma at C6-7   RECONSTRUCTION OF NOSE  1994   Social History   Occupational History   Not on file  Tobacco Use   Smoking status: Never   Smokeless tobacco: Never  Vaping Use   Vaping Use: Never used  Substance and Sexual Activity   Alcohol use: Yes    Comment: occasionally    Drug use: Not  Currently    Types: Marijuana   Sexual activity: Not on file

## 2021-03-22 NOTE — Progress Notes (Signed)
Office Visit Note   Patient: Luke Mcgrath           Date of Birth: November 11, 1985           MRN: [id number] Visit Date: 03/21/2021              Requested by: Eulas Post, MD [place],  [place] [id number] PCP: Eulas Post, MD   Assessment & Plan: Visit Diagnoses:  1. Protrusion of cervical intervertebral disc     Plan: Patient has C5-6 right posterior lateral disc protrusion compressing the right C6 nerve with radiculopathy.  Failed treatment with Flexeril, Neurontin, baclofen, chronic oxycodone usage.  Failed therapy exercise program and multiple chiropractic treatments.  Surgical treatment would be single level cervical fusion C5-6 anteriorly with overnight stay in the hospital.  We discussed use of allograft, plate, postoperative collar x6weeks.  Discussed out of work.  Risks of dysphagia dysphonia, low risk of pseudoarthrosis.  Questions elicited and answered he understands request to proceed.  Follow-Up Instructions: No follow-ups on file.   Orders:  No orders of the defined types were placed in this encounter.  No orders of the defined types were placed in this encounter.     Procedures: No procedures performed   Clinical Data: No additional findings.   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  Patient had previous surgery for a C6-7 laminectomy for a spinal cavernoma surgically treated at [facility] October 10, 2013.  Reimaging studies done 11/30/2017 showed intramedullary lesion spinal cord at C7 unchanged with solitary cavernous malformation.  Patient states the pain is gotten much more severe and new MRI scan was obtained on 03/17/2021 and is available for review.  Patient has been treated with medication for his neck without relief.   He is on chronic oxycodone 5/325     120 tabs a month since his previous neck surgery.  No response to Flexeril, gabapentin.  Patient states that his pain is severe and is not able to tolerate the severe pain.  Patient denies a trauma or injury to his neck.  No shortness of breath no myelopathic symptoms.  Positive significant neck right greater than left shoulder pain severe pain with neck rotation.  He does have history of anxiety.  Review of Systems all other systems noncontributory to HPI other than as mentioned above.   Objective: Vital Signs: BP (!) 147/83   Pulse 86   Ht 6' (1.829 m)   Wt 165 lb (74.8 kg)   BMI 22.38 kg/m   Physical Exam Constitutional:      Appearance: He is well-developed.  HENT:     Head: Normocephalic and atraumatic.     Right Ear: External ear normal.     Left Ear: External ear normal.  Eyes:     Pupils: Pupils are equal, round, and reactive to light.  Neck:     Thyroid: No thyromegaly.     Trachea: No tracheal deviation.  Cardiovascular:     Rate and Rhythm: Normal rate.  Pulmonary:     Effort: Pulmonary effort is normal.     Breath sounds: No wheezing.  Abdominal:     General: Bowel sounds are normal.     Palpations: Abdomen is soft.  Musculoskeletal:     Cervical  back: Neck supple.  Skin:    General: Skin is warm and dry.     Capillary Refill: Capillary refill takes less than 2 seconds.  Neurological:     Mental Status: He is alert and oriented to person, place, and time.  Psychiatric:        Behavior: Behavior normal.        Thought Content: Thought content normal.        Judgment: Judgment normal.    Ortho Exam well-healed posterior incision.  He has severe brachial plexus tenderness on the right positive Spurling on the right negative on the left.  Decreased sensation radial side of his right hand.  He has wrist extension weakness on the right normal on the left.  Specialty Comments:  No specialty comments  available.  Imaging: Narrative & Impression  CLINICAL DATA:  Spinal stenosis. Paraspinal mass/tumor. Worsening neck pain. History of cervical cavernoma.   EXAM: MRI CERVICAL SPINE WITHOUT CONTRAST   TECHNIQUE: Multiplanar, multisequence MR imaging of the cervical spine was performed. No intravenous contrast was administered.   COMPARISON:  Radiography 03/16/2021.  MRI 11/30/2017.   FINDINGS: The study suffers from moderate motion degradation.   Alignment: Normal   Vertebrae: No vertebral body abnormality.   Cord: Cavernoma within the right side of the cord at the C6-7 and C7 level as seen previously. No discernible change.   Posterior Fossa, vertebral arteries, paraspinal tissues: Negative   Disc levels:   Foramen magnum is widely patent.  C1-2 is normal.   C2-3: Mild left-sided uncovertebral prominence. No compressive canal or foraminal narrowing.   C3-4: Endplate osteophytes and mild bulging of the disc. No compressive narrowing of the canal or foramina.   C4-5: Minimal disc bulge.  No canal or foraminal stenosis.   C5-6: Right posterolateral to foraminal disc herniation likely to compress the right C6 nerve. No compression of cord.   C6-7: Minimal bulging of the disc.  No compressive stenosis.   C7-T1: Normal interspace.   IMPRESSION: Right posterolateral to foraminal disc herniation at C5-6 likely to compress the right C6 nerve.   Mild degenerative spondylosis at C2-3, C3-4, C4-5 and C6-7, slightly worsened since 2019 but without apparent neural compression.   Cavernoma within the right side of the cord at the C6-7 and C7 levels, grossly unchanged since the study of 2019. Today's exam suffers from some motion degradation and detail of this lesion is somewhat limited.     Electronically Signed   By: Nelson Chimes M.D.   On: 03/17/2021 16:07        PMFS History: Patient Active Problem List   Diagnosis Date Noted   Protrusion of cervical  intervertebral disc 03/25/2021   Carpal tunnel syndrome of left wrist 01/22/2018   Carpal tunnel syndrome of right wrist 01/22/2018   Prurigo nodularis 06/18/2017   GERD (gastroesophageal reflux disease) 10/06/2013   Arteriovenous fistula of spinal cord vessels 08/01/2013   Neuralgia of left thigh 07/01/2013   Chronic anxiety 07/01/2013   Past Medical History:  Diagnosis Date   Anxiety    Cavernous malformation 09/2013   Cervical region    Family History  Problem Relation Age of Onset   Healthy Mother    Hyperlipidemia Father    Hypertension Father    Diabetes Father    Cancer Maternal Grandmother        colon   Hyperlipidemia Maternal Grandmother    Other Brother        Died, Nature conservation officer services in [place]  Healthy Sister     Past Surgical History:  Procedure Laterality Date   BRAIN SURGERY  1990   age 26   CERVICAL LAMINECTOMY FOR EXCISION OF NEOPLASM  2015   Cervical cavernoma at C6-7   RECONSTRUCTION OF NOSE  1994   Social History   Occupational History   Not on file  Tobacco Use   Smoking status: Never   Smokeless tobacco: Never  Vaping Use   Vaping Use: Never used  Substance and Sexual Activity   Alcohol use: Yes    Comment: occasionally    Drug use: Not Currently    Types: Marijuana   Sexual activity: Not on file

## 2021-03-25 ENCOUNTER — Encounter: Payer: Self-pay | Admitting: Orthopaedic Surgery

## 2021-03-25 DIAGNOSIS — M5020 Other cervical disc displacement, unspecified cervical region: Secondary | ICD-10-CM | POA: Insufficient documentation

## 2021-03-28 ENCOUNTER — Telehealth: Payer: Self-pay | Admitting: Orthopaedic Surgery

## 2021-03-28 NOTE — Telephone Encounter (Signed)
Received $50.00 cash, medical records release form and disability paperwork from patient/Forwarding to [facility] today

## 2021-03-29 ENCOUNTER — Ambulatory Visit: Payer: BC Managed Care – PPO | Admitting: Orthopaedic Surgery

## 2021-03-31 ENCOUNTER — Ambulatory Visit: Payer: BC Managed Care – PPO | Admitting: Surgery

## 2021-04-01 ENCOUNTER — Other Ambulatory Visit: Payer: Self-pay

## 2021-04-04 NOTE — Progress Notes (Signed)
Left message for Luke Mcgrath at [facility] office,requesting pre op orders for this patient.

## 2021-04-04 NOTE — Progress Notes (Signed)
Surgical Instructions    Your procedure is scheduled on Wednesday August 3.  Report to [facility] Main Entrance "A" at 1030 A.M., then check in with the Admitting office.  Call this number if you have problems the morning of surgery:  [phone]   If you have any questions prior to your surgery date call [phone]: Open Monday-Friday 8am-4pm    Remember:  Do not eat or drink  after midnight the night before your surgery.      Take these medicines the morning of surgery with A SIP OF WATER:             cyclobenzaprine (FLEXERIL) if needed             gabapentin (NEURONTIN)             oxyCODONE-acetaminophen (PERCOCET/ROXICET) if needed   As of today, STOP taking any Aspirin (unless otherwise instructed by your surgeon) Aleve, Naproxen, Ibuprofen, Motrin, Advil, Goody's, BC's, all herbal medications, fish oil, and all vitamins.          Do not wear jewelry or makeup Do not wear lotions, powders, perfumes/colognes, or deodorant. Do not shave 48 hours prior to surgery.  Men may shave face and neck. Do not bring valuables to the hospital. DO Not wear nail polish, gel polish, artificial nails, or any other type of covering on  natural nails including finger and toenails. If patients have artificial nails, gel coating, etc. that need to be removed by a nail salon please have this removed prior to surgery or surgery may need to be canceled/delayed if the surgeon/ anesthesia feels like the patient is unable to be adequately monitored.             [facility] is not responsible for any belongings or valuables.  Do NOT Smoke (Tobacco/Vaping) or drink Alcohol 24 hours prior to your procedure If you use a CPAP at night, you may bring all equipment for your overnight stay.   Contacts, glasses, dentures or bridgework may not be worn into surgery, please bring cases for these belongings   For patients admitted to the hospital, discharge time will be determined by your treatment team.    Patients discharged the day of surgery will not be allowed to drive home, and someone needs to stay with them for 24 hours.  ONLY 1 SUPPORT PERSON MAY BE PRESENT WHILE YOU ARE IN SURGERY. IF YOU ARE TO BE ADMITTED ONCE YOU ARE IN YOUR ROOM YOU WILL BE ALLOWED TWO (2) VISITORS.  Minor children may have two parents present. Special consideration for safety and communication needs will be reviewed on a case by case basis.  Special instructions:    Oral Hygiene is also important to reduce your risk of infection.  Remember - BRUSH YOUR TEETH THE MORNING OF SURGERY WITH YOUR REGULAR TOOTHPASTE   [place]- Preparing For Surgery  Before surgery, you can play an important role. Because skin is not sterile, your skin needs to be as free of germs as possible. You can reduce the number of germs on your skin by washing with CHG (chlorahexidine gluconate) Soap before surgery.  CHG is an antiseptic cleaner which kills germs and bonds with the skin to continue killing germs even after washing.     Please do not use if you have an allergy to CHG or antibacterial soaps. If your skin becomes reddened/irritated stop using the CHG.  Do not shave (including legs and underarms) for at least 48 hours prior  to first CHG shower. It is OK to shave your face.  Please follow these instructions carefully.     Shower the NIGHT BEFORE SURGERY and the MORNING OF SURGERY with CHG Soap.   If you chose to wash your hair, wash your hair first as usual with your normal shampoo. After you shampoo, rinse your hair and body thoroughly to remove the shampoo.  Then [employer] and genitals (private parts) with your normal soap and rinse thoroughly to remove soap.  After that Use CHG Soap as you would any other liquid soap. You can apply CHG directly to the skin and wash gently with a scrungie or a clean washcloth.   Apply the CHG Soap to your body ONLY FROM THE NECK DOWN.  Do not use on open wounds or open sores. Avoid contact  with your eyes, ears, mouth and genitals (private parts). Wash Face and genitals (private parts)  with your normal soap.   Wash thoroughly, paying special attention to the area where your surgery will be performed.  Thoroughly rinse your body with warm water from the neck down.  DO NOT shower/wash with your normal soap after using and rinsing off the CHG Soap.  Pat yourself dry with a CLEAN TOWEL.  Wear CLEAN PAJAMAS to bed the night before surgery  Place CLEAN SHEETS on your bed the night before your surgery  DO NOT SLEEP WITH PETS.   Day of Surgery:  Take a shower with CHG soap. Wear Clean/Comfortable clothing the morning of surgery Do not apply any deodorants/lotions.   Remember to brush your teeth WITH YOUR REGULAR TOOTHPASTE.   Please read over the following fact sheets that you were given.

## 2021-04-05 ENCOUNTER — Encounter (HOSPITAL_COMMUNITY)
Admission: RE | Admit: 2021-04-05 | Discharge: 2021-04-05 | Disposition: A | Discharge status: Home / Self Care | Payer: BC Managed Care – PPO | Source: Ambulatory Visit | Attending: Orthopaedic Surgery | Admitting: Orthopaedic Surgery

## 2021-04-05 ENCOUNTER — Other Ambulatory Visit: Payer: Self-pay

## 2021-04-05 ENCOUNTER — Encounter (HOSPITAL_COMMUNITY): Payer: Self-pay

## 2021-04-05 DIAGNOSIS — Z01812 Encounter for preprocedural laboratory examination: Secondary | ICD-10-CM | POA: Insufficient documentation

## 2021-04-05 HISTORY — DX: Gastro-esophageal reflux disease without esophagitis: K21.9

## 2021-04-05 LAB — CBC
HCT: 44.1 % (ref 39.0–52.0)
Hemoglobin: 15.6 g/dL (ref 13.0–17.0)
MCH: 31.4 pg (ref 26.0–34.0)
MCHC: 35.4 g/dL (ref 30.0–36.0)
MCV: 88.7 fL (ref 80.0–100.0)
Platelets: 222 10*3/uL (ref 150–400)
RBC: 4.97 MIL/uL (ref 4.22–5.81)
RDW: 11.4 % — ABNORMAL LOW (ref 11.5–15.5)
WBC: 3.8 10*3/uL — ABNORMAL LOW (ref 4.0–10.5)
nRBC: 0 % (ref 0.0–0.2)

## 2021-04-05 LAB — SURGICAL PCR SCREEN
MRSA, PCR: NEGATIVE
Staphylococcus aureus: POSITIVE — AB

## 2021-04-05 NOTE — Progress Notes (Signed)
Surgical Instructions    Your procedure is scheduled on 04/13/21.  Report to [facility] Main Entrance "A" at 10:30 A.M., then check in with the Admitting office.  Call this number if you have problems the morning of surgery:  [phone]   If you have any questions prior to your surgery date call [phone]: Open Monday-Friday 8am-4pm    Remember:  Do not eat after midnight the night before your surgery  You may drink clear liquids until 09:30am the morning of your surgery.   Clear liquids allowed are: Water, Non-Citrus Juices (without pulp), Carbonated Beverages, Clear Tea, Black Coffee Only, and Gatorade    Take these medicines the morning of surgery with A SIP OF WATER  gabapentin (NEURONTIN) oxyCODONE-acetaminophen (PERCOCET/ROXICET) if needed    As of today, STOP taking any Aspirin (unless otherwise instructed by your surgeon) Aleve, Naproxen, Ibuprofen, Motrin, Advil, Goody's, BC's, all herbal medications, fish oil, and all vitamins.          Do not wear jewelry or makeup Do not wear lotions, powders, perfumes/colognes, or deodorant. Men may shave face and neck. Do not bring valuables to the hospital.  DO Not wear nail polish, gel polish, artificial nails, or any other type of covering on natural nails  including finger and toenails. If patients have artificial nails, gel coating, etc. that need to be removed by a nail salon please have this removed prior to surgery or surgery may need to be canceled/delayed if the surgeon/ anesthesia feels like the patient is unable to be adequately monitored.             [facility] is not responsible for any belongings or valuables.  Do NOT Smoke (Tobacco/Vaping) or drink Alcohol 24 hours prior to your procedure If you use a CPAP at night, you may bring all equipment for your overnight stay.   Contacts, glasses, dentures or bridgework may not be worn into surgery, please bring cases for these belongings   For patients admitted to  the hospital, discharge time will be determined by your treatment team.   Patients discharged the day of surgery will not be allowed to drive home, and someone needs to stay with them for 24 hours.  ONLY 1 SUPPORT PERSON MAY BE PRESENT WHILE YOU ARE IN SURGERY. IF YOU ARE TO BE ADMITTED ONCE YOU ARE IN YOUR ROOM YOU WILL BE ALLOWED TWO (2) VISITORS.  Minor children may have two parents present. Special consideration for safety and communication needs will be reviewed on a case by case basis.  Special instructions:    Oral Hygiene is also important to reduce your risk of infection.  Remember - BRUSH YOUR TEETH THE MORNING OF SURGERY WITH YOUR REGULAR TOOTHPASTE   [place]- Preparing For Surgery  Before surgery, you can play an important role. Because skin is not sterile, your skin needs to be as free of germs as possible. You can reduce the number of germs on your skin by washing with CHG (chlorahexidine gluconate) Soap before surgery.  CHG is an antiseptic cleaner which kills germs and bonds with the skin to continue killing germs even after washing.     Please do not use if you have an allergy to CHG or antibacterial soaps. If your skin becomes reddened/irritated stop using the CHG.  Do not shave (including legs and underarms) for at least 48 hours prior to first CHG shower. It is OK to shave your face.  Please follow these instructions carefully.  Shower the NIGHT BEFORE SURGERY and the MORNING OF SURGERY with CHG Soap.   If you chose to wash your hair, wash your hair first as usual with your normal shampoo. After you shampoo, rinse your hair and body thoroughly to remove the shampoo.  Then [employer] and genitals (private parts) with your normal soap and rinse thoroughly to remove soap.  After that Use CHG Soap as you would any other liquid soap. You can apply CHG directly to the skin and wash gently with a scrungie or a clean washcloth.   Apply the CHG Soap to your body ONLY FROM  THE NECK DOWN.  Do not use on open wounds or open sores. Avoid contact with your eyes, ears, mouth and genitals (private parts). Wash Face and genitals (private parts)  with your normal soap.   Wash thoroughly, paying special attention to the area where your surgery will be performed.  Thoroughly rinse your body with warm water from the neck down.  DO NOT shower/wash with your normal soap after using and rinsing off the CHG Soap.  Pat yourself dry with a CLEAN TOWEL.  Wear CLEAN PAJAMAS to bed the night before surgery  Place CLEAN SHEETS on your bed the night before your surgery  DO NOT SLEEP WITH PETS.   Day of Surgery: Take a shower with CHG soap. Wear Clean/Comfortable clothing the morning of surgery Do not apply any deodorants/lotions.   Remember to brush your teeth WITH YOUR REGULAR TOOTHPASTE.   Please read over the following fact sheets that you were given.

## 2021-04-05 NOTE — Progress Notes (Deleted)
Surgical Instructions    Your procedure is scheduled on Wednesday August 3.  Report to [facility] Main Entrance "A" at 1030 A.M., then check in with the Admitting office.  Call this number if you have problems the morning of surgery:  [phone]   If you have any questions prior to your surgery date call [phone]: Open Monday-Friday 8am-4pm    Remember:  Do not eat after midnight the night before your surgery.        Take these medicines the morning of surgery with A SIP OF WATER:             cyclobenzaprine (FLEXERIL) if needed             gabapentin (NEURONTIN)             oxyCODONE-acetaminophen (PERCOCET/ROXICET) if needed   As of today, STOP taking any Aspirin (unless otherwise instructed by your surgeon) Aleve, Naproxen, Ibuprofen, Motrin, Advil, Goody's, BC's, all herbal medications, fish oil, and all vitamins.          Do not wear jewelry or makeup Do not wear lotions, powders, perfumes/colognes, or deodorant. Do not shave 48 hours prior to surgery.  Men may shave face and neck. Do not bring valuables to the hospital. DO Not wear nail polish, gel polish, artificial nails, or any other type of covering on  natural nails including finger and toenails. If patients have artificial nails, gel coating, etc. that need to be removed by a nail salon please have this removed prior to surgery or surgery may need to be canceled/delayed if the surgeon/ anesthesia feels like the patient is unable to be adequately monitored.             [facility] is not responsible for any belongings or valuables.  Do NOT Smoke (Tobacco/Vaping) or drink Alcohol 24 hours prior to your procedure If you use a CPAP at night, you may bring all equipment for your overnight stay.   Contacts, glasses, dentures or bridgework may not be worn into surgery, please bring cases for these belongings   For patients admitted to the hospital, discharge time will be determined by your treatment team.    Patients discharged the day of surgery will not be allowed to drive home, and someone needs to stay with them for 24 hours.  ONLY 1 SUPPORT PERSON MAY BE PRESENT WHILE YOU ARE IN SURGERY. IF YOU ARE TO BE ADMITTED ONCE YOU ARE IN YOUR ROOM YOU WILL BE ALLOWED TWO (2) VISITORS.  Minor children may have two parents present. Special consideration for safety and communication needs will be reviewed on a case by case basis.  Special instructions:    Oral Hygiene is also important to reduce your risk of infection.  Remember - BRUSH YOUR TEETH THE MORNING OF SURGERY WITH YOUR REGULAR TOOTHPASTE   [place]- Preparing For Surgery  Before surgery, you can play an important role. Because skin is not sterile, your skin needs to be as free of germs as possible. You can reduce the number of germs on your skin by washing with CHG (chlorahexidine gluconate) Soap before surgery.  CHG is an antiseptic cleaner which kills germs and bonds with the skin to continue killing germs even after washing.     Please do not use if you have an allergy to CHG or antibacterial soaps. If your skin becomes reddened/irritated stop using the CHG.  Do not shave (including legs and underarms) for at least 48 hours prior to  first CHG shower. It is OK to shave your face.  Please follow these instructions carefully.     Shower the NIGHT BEFORE SURGERY and the MORNING OF SURGERY with CHG Soap.   If you chose to wash your hair, wash your hair first as usual with your normal shampoo. After you shampoo, rinse your hair and body thoroughly to remove the shampoo.  Then [employer] and genitals (private parts) with your normal soap and rinse thoroughly to remove soap.  After that Use CHG Soap as you would any other liquid soap. You can apply CHG directly to the skin and wash gently with a scrungie or a clean washcloth.   Apply the CHG Soap to your body ONLY FROM THE NECK DOWN.  Do not use on open wounds or open sores. Avoid contact  with your eyes, ears, mouth and genitals (private parts). Wash Face and genitals (private parts)  with your normal soap.   Wash thoroughly, paying special attention to the area where your surgery will be performed.  Thoroughly rinse your body with warm water from the neck down.  DO NOT shower/wash with your normal soap after using and rinsing off the CHG Soap.  Pat yourself dry with a CLEAN TOWEL.  Wear CLEAN PAJAMAS to bed the night before surgery  Place CLEAN SHEETS on your bed the night before your surgery  DO NOT SLEEP WITH PETS.   Day of Surgery:  Take a shower with CHG soap. Wear Clean/Comfortable clothing the morning of surgery Do not apply any deodorants/lotions.   Remember to brush your teeth WITH YOUR REGULAR TOOTHPASTE.   Please read over the following fact sheets that you were given.

## 2021-04-05 NOTE — Progress Notes (Signed)
PCP - Carolann Littler Cardiologist - denies Pain management doctor: dr. Mikeal Hawthorne sun  PPM/ICD - denies   Chest x-ray - n/a EKG - n/a Stress Test -denies  ECHO - denies Cardiac Cath - denies  Sleep Study - denies   No diabetes  As of today, STOP taking any Aspirin (unless otherwise instructed by your surgeon) Aleve, Naproxen, Ibuprofen, Motrin, Advil, Goody's, BC's, all herbal medications, fish oil, and all vitamins.  ERAS Protcol -yes PRE-SURGERY Ensure or G2- no  COVID TEST- informed patient to go to [facility] 8/1 or 8/2.   Anesthesia review: no  Patient denies shortness of breath, fever, cough and chest pain at PAT appointment   All instructions explained to the patient, with a verbal understanding of the material. Patient agrees to go over the instructions while at home for a better understanding. Patient also instructed to self quarantine after being tested for COVID-19. The opportunity to ask questions was provided.

## 2021-04-11 ENCOUNTER — Other Ambulatory Visit: Payer: Self-pay | Admitting: Orthopaedic Surgery

## 2021-04-12 LAB — SARS CORONAVIRUS 2 (TAT 6-24 HRS): SARS Coronavirus 2: NEGATIVE

## 2021-04-13 ENCOUNTER — Encounter (HOSPITAL_COMMUNITY): Payer: Self-pay | Admitting: Orthopaedic Surgery

## 2021-04-13 ENCOUNTER — Ambulatory Visit (HOSPITAL_COMMUNITY): Payer: BC Managed Care – PPO | Admitting: Anesthesiology

## 2021-04-13 ENCOUNTER — Ambulatory Visit (HOSPITAL_COMMUNITY): Payer: BC Managed Care – PPO

## 2021-04-13 ENCOUNTER — Other Ambulatory Visit: Payer: Self-pay

## 2021-04-13 ENCOUNTER — Encounter (HOSPITAL_COMMUNITY)
Admission: RE | Disposition: A | Discharge status: Home / Self Care | Payer: Self-pay | Source: Home / Self Care | Attending: Orthopaedic Surgery

## 2021-04-13 ENCOUNTER — Observation Stay (HOSPITAL_COMMUNITY)
Admission: RE | Admit: 2021-04-13 | Discharge: 2021-04-14 | Disposition: A | Discharge status: Home / Self Care | Payer: BC Managed Care – PPO | Attending: Orthopaedic Surgery | Admitting: Orthopaedic Surgery

## 2021-04-13 DIAGNOSIS — Z981 Arthrodesis status: Secondary | ICD-10-CM

## 2021-04-13 DIAGNOSIS — M5020 Other cervical disc displacement, unspecified cervical region: Secondary | ICD-10-CM

## 2021-04-13 DIAGNOSIS — Z419 Encounter for procedure for purposes other than remedying health state, unspecified: Secondary | ICD-10-CM

## 2021-04-13 DIAGNOSIS — M50122 Cervical disc disorder at C5-C6 level with radiculopathy: Secondary | ICD-10-CM | POA: Diagnosis not present

## 2021-04-13 HISTORY — PX: ANTERIOR CERVICAL DECOMP/DISCECTOMY FUSION: SHX1161

## 2021-04-13 LAB — URINALYSIS, ROUTINE W REFLEX MICROSCOPIC
Bilirubin Urine: NEGATIVE
Glucose, UA: NEGATIVE mg/dL
Hgb urine dipstick: NEGATIVE
Ketones, ur: NEGATIVE mg/dL
Leukocytes,Ua: NEGATIVE
Nitrite: NEGATIVE
Protein, ur: NEGATIVE mg/dL
Specific Gravity, Urine: 1.018 (ref 1.005–1.030)
pH: 8 (ref 5.0–8.0)

## 2021-04-13 LAB — CBC
HCT: 45.3 % (ref 39.0–52.0)
Hemoglobin: 15.7 g/dL (ref 13.0–17.0)
MCH: 31.2 pg (ref 26.0–34.0)
MCHC: 34.7 g/dL (ref 30.0–36.0)
MCV: 90.1 fL (ref 80.0–100.0)
Platelets: 239 10*3/uL (ref 150–400)
RBC: 5.03 MIL/uL (ref 4.22–5.81)
RDW: 11.4 % — ABNORMAL LOW (ref 11.5–15.5)
WBC: 5.2 10*3/uL (ref 4.0–10.5)
nRBC: 0 % (ref 0.0–0.2)

## 2021-04-13 LAB — COMPREHENSIVE METABOLIC PANEL
ALT: 12 U/L (ref 0–44)
AST: 18 U/L (ref 15–41)
Albumin: 4.5 g/dL (ref 3.5–5.0)
Alkaline Phosphatase: 57 U/L (ref 38–126)
Anion gap: 10 (ref 5–15)
BUN: 12 mg/dL (ref 6–20)
CO2: 23 mmol/L (ref 22–32)
Calcium: 9.2 mg/dL (ref 8.9–10.3)
Chloride: 104 mmol/L (ref 98–111)
Creatinine, Ser: 0.82 mg/dL (ref 0.61–1.24)
GFR, Estimated: >60 mL/min (ref >60)
Glucose, Bld: 97 mg/dL (ref 70–99)
Potassium: 3.8 mmol/L (ref 3.5–5.1)
Sodium: 137 mmol/L (ref 135–145)
Total Bilirubin: 1 mg/dL (ref 0.3–1.2)
Total Protein: 7.1 g/dL (ref 6.5–8.1)

## 2021-04-13 SURGERY — ANTERIOR CERVICAL DECOMPRESSION/DISCECTOMY FUSION 1 LEVEL
Anesthesia: General

## 2021-04-13 MED ORDER — MIDAZOLAM HCL 2 MG/2ML IJ SOLN
INTRAMUSCULAR | Status: DC | PRN
Start: 2021-04-13 — End: 2021-04-13
  Administered 2021-04-13: 2 mg via INTRAVENOUS

## 2021-04-13 MED ORDER — OXYCODONE HCL 5 MG PO TABS
10.0000 mg | ORAL_TABLET | ORAL | Status: DC | PRN
  Administered 2021-04-13 – 2021-04-14 (×5): 10 mg via ORAL
  Filled 2021-04-13 (×5): qty 2

## 2021-04-13 MED ORDER — ACETAMINOPHEN 325 MG PO TABS
650.0000 mg | ORAL_TABLET | ORAL | Status: DC | PRN
  Administered 2021-04-13 – 2021-04-14 (×3): 650 mg via ORAL
  Filled 2021-04-13 (×3): qty 2

## 2021-04-13 MED ORDER — METHOCARBAMOL 500 MG PO TABS
500.0000 mg | ORAL_TABLET | Freq: Four times a day (QID) | ORAL | Status: DC | PRN
  Administered 2021-04-13 – 2021-04-14 (×3): 500 mg via ORAL
  Filled 2021-04-13 (×3): qty 1

## 2021-04-13 MED ORDER — KETOROLAC TROMETHAMINE 30 MG/ML IJ SOLN: 30.0000 mg | Freq: Once | INTRAMUSCULAR | Status: DC

## 2021-04-13 MED ORDER — KETAMINE HCL 50 MG/5ML IJ SOSY
PREFILLED_SYRINGE | INTRAMUSCULAR | Status: AC
  Filled 2021-04-13: qty 5

## 2021-04-13 MED ORDER — DEXAMETHASONE SODIUM PHOSPHATE 10 MG/ML IJ SOLN
INTRAMUSCULAR | Status: DC | PRN
Start: 2021-04-13 — End: 2021-04-13
  Administered 2021-04-13: 10 mg via INTRAVENOUS

## 2021-04-13 MED ORDER — ONDANSETRON HCL 4 MG/2ML IJ SOLN: 4.0000 mg | Freq: Four times a day (QID) | INTRAMUSCULAR | Status: DC | PRN

## 2021-04-13 MED ORDER — ACETAMINOPHEN 650 MG RE SUPP: 650.0000 mg | RECTAL | Status: DC | PRN

## 2021-04-13 MED ORDER — BUPIVACAINE-EPINEPHRINE 0.5% -1:200000 IJ SOLN
INTRAMUSCULAR | Status: AC
  Filled 2021-04-13: qty 1

## 2021-04-13 MED ORDER — PHENYLEPHRINE HCL-NACL 20-0.9 MG/250ML-% IV SOLN
INTRAVENOUS | Status: AC
  Filled 2021-04-13: qty 250

## 2021-04-13 MED ORDER — PROPOFOL 10 MG/ML IV BOLUS
INTRAVENOUS | Status: DC | PRN
  Administered 2021-04-13: 200 mg via INTRAVENOUS

## 2021-04-13 MED ORDER — DEXMEDETOMIDINE HCL IN NACL 200 MCG/50ML IV SOLN
INTRAVENOUS | Status: DC | PRN
  Administered 2021-04-13 (×3): 8 ug via INTRAVENOUS
  Administered 2021-04-13: 4 ug via INTRAVENOUS

## 2021-04-13 MED ORDER — OXYCODONE-ACETAMINOPHEN 5-325 MG PO TABS: 1.0000 | ORAL_TABLET | Freq: Four times a day (QID) | ORAL | Status: DC | PRN

## 2021-04-13 MED ORDER — OXYCODONE HCL 5 MG PO TABS
5.0000 mg | ORAL_TABLET | Freq: Once | ORAL | Status: DC | PRN
Start: 2021-04-13 — End: 2021-04-13

## 2021-04-13 MED ORDER — PROMETHAZINE HCL 25 MG/ML IJ SOLN: 6.2500 mg | INTRAMUSCULAR | Status: DC | PRN

## 2021-04-13 MED ORDER — DEXAMETHASONE SODIUM PHOSPHATE 10 MG/ML IJ SOLN
INTRAMUSCULAR | Status: AC
  Filled 2021-04-13: qty 1

## 2021-04-13 MED ORDER — MEPERIDINE HCL 25 MG/ML IJ SOLN: 6.2500 mg | INTRAMUSCULAR | Status: DC | PRN

## 2021-04-13 MED ORDER — KETOROLAC TROMETHAMINE 30 MG/ML IJ SOLN
INTRAMUSCULAR | Status: AC
  Filled 2021-04-13: qty 1

## 2021-04-13 MED ORDER — SODIUM CHLORIDE 0.9 % IV SOLN: 250.0000 mL | INTRAVENOUS | Status: DC

## 2021-04-13 MED ORDER — SODIUM CHLORIDE 0.9% FLUSH: 3.0000 mL | INTRAVENOUS | Status: DC | PRN

## 2021-04-13 MED ORDER — CHOLECALCIFEROL 10 MCG (400 UNIT) PO TABS
400.0000 [IU] | ORAL_TABLET | Freq: Every day | ORAL | Status: DC
  Filled 2021-04-13 (×2): qty 1

## 2021-04-13 MED ORDER — LACTATED RINGERS IV SOLN: INTRAVENOUS | Status: DC

## 2021-04-13 MED ORDER — PROPOFOL 10 MG/ML IV BOLUS
INTRAVENOUS | Status: AC
  Filled 2021-04-13: qty 20

## 2021-04-13 MED ORDER — SUGAMMADEX SODIUM 200 MG/2ML IV SOLN
INTRAVENOUS | Status: DC | PRN
  Administered 2021-04-13: 200 mg via INTRAVENOUS

## 2021-04-13 MED ORDER — CEFAZOLIN SODIUM-DEXTROSE 2-4 GM/100ML-% IV SOLN
2.0000 g | INTRAVENOUS | Status: AC
  Administered 2021-04-13: 2 g via INTRAVENOUS
  Filled 2021-04-13: qty 100

## 2021-04-13 MED ORDER — GABAPENTIN 300 MG PO CAPS
300.0000 mg | ORAL_CAPSULE | Freq: Three times a day (TID) | ORAL | Status: DC
  Administered 2021-04-13 (×2): 300 mg via ORAL
  Filled 2021-04-13 (×2): qty 1

## 2021-04-13 MED ORDER — ONDANSETRON HCL 4 MG/2ML IJ SOLN
INTRAMUSCULAR | Status: AC
  Filled 2021-04-13: qty 2

## 2021-04-13 MED ORDER — ASCORBIC ACID 500 MG PO TABS: 500.0000 mg | ORAL_TABLET | Freq: Every day | ORAL | Status: DC

## 2021-04-13 MED ORDER — CHLORHEXIDINE GLUCONATE 0.12 % MT SOLN
15.0000 mL | Freq: Once | OROMUCOSAL | Status: AC
  Administered 2021-04-13: 15 mL via OROMUCOSAL
  Filled 2021-04-13: qty 15

## 2021-04-13 MED ORDER — HYDROMORPHONE HCL 1 MG/ML IJ SOLN
INTRAMUSCULAR | Status: AC
  Filled 2021-04-13: qty 1

## 2021-04-13 MED ORDER — ACETAMINOPHEN 500 MG PO TABS
1000.0000 mg | ORAL_TABLET | Freq: Once | ORAL | Status: AC
  Administered 2021-04-13: 1000 mg via ORAL
  Filled 2021-04-13: qty 2

## 2021-04-13 MED ORDER — LIDOCAINE 2% (20 MG/ML) 5 ML SYRINGE
INTRAMUSCULAR | Status: AC
  Filled 2021-04-13: qty 5

## 2021-04-13 MED ORDER — ROCURONIUM BROMIDE 10 MG/ML (PF) SYRINGE
PREFILLED_SYRINGE | INTRAVENOUS | Status: AC
  Filled 2021-04-13: qty 10

## 2021-04-13 MED ORDER — HEMOSTATIC AGENTS (NO CHARGE) OPTIME
TOPICAL | Status: DC | PRN
Start: 2021-04-13 — End: 2021-04-13
  Administered 2021-04-13: 1 via TOPICAL

## 2021-04-13 MED ORDER — KETAMINE HCL 10 MG/ML IJ SOLN
INTRAMUSCULAR | Status: DC | PRN
  Administered 2021-04-13: 40 mg via INTRAVENOUS
  Administered 2021-04-13: 10 mg via INTRAVENOUS

## 2021-04-13 MED ORDER — MENTHOL 3 MG MT LOZG: 1.0000 | LOZENGE | OROMUCOSAL | Status: DC | PRN

## 2021-04-13 MED ORDER — SODIUM CHLORIDE 0.9% FLUSH
3.0000 mL | Freq: Two times a day (BID) | INTRAVENOUS | Status: DC
  Administered 2021-04-13: 3 mL via INTRAVENOUS

## 2021-04-13 MED ORDER — BUPIVACAINE-EPINEPHRINE 0.5% -1:200000 IJ SOLN
INTRAMUSCULAR | Status: DC | PRN
  Administered 2021-04-13: 6 mL

## 2021-04-13 MED ORDER — METHOCARBAMOL 1000 MG/10ML IJ SOLN
500.0000 mg | Freq: Four times a day (QID) | INTRAVENOUS | Status: DC | PRN
  Filled 2021-04-13: qty 5

## 2021-04-13 MED ORDER — HYDROMORPHONE HCL 1 MG/ML IJ SOLN
0.5000 mg | INTRAMUSCULAR | Status: DC | PRN
  Administered 2021-04-14: 0.5 mg via INTRAVENOUS
  Filled 2021-04-13: qty 0.5

## 2021-04-13 MED ORDER — OXYCODONE HCL 5 MG PO TABS: 5.0000 mg | ORAL_TABLET | ORAL | Status: DC | PRN

## 2021-04-13 MED ORDER — ONDANSETRON HCL 4 MG/2ML IJ SOLN
INTRAMUSCULAR | Status: DC | PRN
  Administered 2021-04-13: 4 mg via INTRAVENOUS

## 2021-04-13 MED ORDER — ORAL CARE MOUTH RINSE: 15.0000 mL | Freq: Once | OROMUCOSAL | Status: AC

## 2021-04-13 MED ORDER — MIDAZOLAM HCL 2 MG/2ML IJ SOLN
INTRAMUSCULAR | Status: AC
  Filled 2021-04-13: qty 2

## 2021-04-13 MED ORDER — FENTANYL CITRATE (PF) 250 MCG/5ML IJ SOLN
INTRAMUSCULAR | Status: AC
  Filled 2021-04-13: qty 5

## 2021-04-13 MED ORDER — SODIUM CHLORIDE 0.45 % IV SOLN: INTRAVENOUS | Status: DC

## 2021-04-13 MED ORDER — FENTANYL CITRATE (PF) 250 MCG/5ML IJ SOLN
INTRAMUSCULAR | Status: DC | PRN
  Administered 2021-04-13 (×2): 50 ug via INTRAVENOUS
  Administered 2021-04-13: 100 ug via INTRAVENOUS
  Administered 2021-04-13: 50 ug via INTRAVENOUS

## 2021-04-13 MED ORDER — ONDANSETRON HCL 4 MG PO TABS: 4.0000 mg | ORAL_TABLET | Freq: Four times a day (QID) | ORAL | Status: DC | PRN

## 2021-04-13 MED ORDER — LIDOCAINE 2% (20 MG/ML) 5 ML SYRINGE
INTRAMUSCULAR | Status: DC | PRN
  Administered 2021-04-13: 60 mg via INTRAVENOUS

## 2021-04-13 MED ORDER — HYDROMORPHONE HCL 1 MG/ML IJ SOLN
0.2500 mg | INTRAMUSCULAR | Status: DC | PRN
  Administered 2021-04-13 (×2): 0.5 mg via INTRAVENOUS

## 2021-04-13 MED ORDER — KETOROLAC TROMETHAMINE 30 MG/ML IJ SOLN
30.0000 mg | Freq: Once | INTRAMUSCULAR | Status: AC | PRN
  Administered 2021-04-13: 30 mg via INTRAVENOUS

## 2021-04-13 MED ORDER — 0.9 % SODIUM CHLORIDE (POUR BTL) OPTIME
TOPICAL | Status: DC | PRN
  Administered 2021-04-13: 1000 mL

## 2021-04-13 MED ORDER — OXYCODONE HCL 5 MG/5ML PO SOLN: 5.0000 mg | Freq: Once | ORAL | Status: DC | PRN

## 2021-04-13 MED ORDER — ROCURONIUM BROMIDE 10 MG/ML (PF) SYRINGE
PREFILLED_SYRINGE | INTRAVENOUS | Status: DC | PRN
  Administered 2021-04-13 (×2): 20 mg via INTRAVENOUS
  Administered 2021-04-13: 100 mg via INTRAVENOUS

## 2021-04-13 MED ORDER — PHENOL 1.4 % MT LIQD: 1.0000 | OROMUCOSAL | Status: DC | PRN

## 2021-04-13 SURGICAL SUPPLY — 50 items
BAG COUNTER SPONGE SURGICOUNT (BAG) ×2 IMPLANT
BENZOIN TINCTURE PRP APPL 2/3 (GAUZE/BANDAGES/DRESSINGS) ×2 IMPLANT
BIT DRILL SMALL W/STOP 14 (BIT) ×2 IMPLANT
BONE CC-ACS 11X14X7 6D (Bone Implant) ×2 IMPLANT
BUR ROUND FLUTED 4 SOFT TCH (BURR) ×2 IMPLANT
CHIPS BONE CANC-ACS11X14X7 6D (Bone Implant) ×1 IMPLANT
CLSR STERI-STRIP ANTIMIC 1/2X4 (GAUZE/BANDAGES/DRESSINGS) ×2 IMPLANT
COLLAR CERV LO CONTOUR FIRM DE (SOFTGOODS) ×2 IMPLANT
COVER MAYO STAND STRL (DRAPES) ×2 IMPLANT
COVER SURGICAL LIGHT HANDLE (MISCELLANEOUS) ×2 IMPLANT
DRAPE C-ARM 42X72 X-RAY (DRAPES) ×2 IMPLANT
DRAPE HALF SHEET 40X57 (DRAPES) ×2 IMPLANT
DRAPE MICROSCOPE LEICA (MISCELLANEOUS) ×2 IMPLANT
DURAPREP 6ML APPLICATOR 50/CS (WOUND CARE) ×2 IMPLANT
ELECT COATED BLADE 2.86 ST (ELECTRODE) ×2 IMPLANT
ELECT REM PT RETURN 9FT ADLT (ELECTROSURGICAL) ×2
ELECTRODE REM PT RTRN 9FT ADLT (ELECTROSURGICAL) ×1 IMPLANT
EVACUATOR 1/8 PVC DRAIN (DRAIN) ×2 IMPLANT
GAUZE SPONGE 4X4 12PLY STRL (GAUZE/BANDAGES/DRESSINGS) ×2 IMPLANT
GLOVE SRG 8 PF TXTR STRL LF DI (GLOVE) ×2 IMPLANT
GLOVE SURG ORTHO LTX SZ7.5 (GLOVE) ×4 IMPLANT
GLOVE SURG UNDER POLY LF SZ8 (GLOVE) ×2
GOWN STRL REUS W/ TWL LRG LVL3 (GOWN DISPOSABLE) ×1 IMPLANT
GOWN STRL REUS W/ TWL XL LVL3 (GOWN DISPOSABLE) ×1 IMPLANT
GOWN STRL REUS W/TWL 2XL LVL3 (GOWN DISPOSABLE) ×2 IMPLANT
GOWN STRL REUS W/TWL LRG LVL3 (GOWN DISPOSABLE) ×2
GOWN STRL REUS W/TWL XL LVL3 (GOWN DISPOSABLE) ×1
HALTER HD/CHIN CERV TRACTION D (MISCELLANEOUS) ×2 IMPLANT
KIT BASIN OR (CUSTOM PROCEDURE TRAY) ×2 IMPLANT
KIT TURNOVER KIT B (KITS) ×2 IMPLANT
MANIFOLD NEPTUNE II (INSTRUMENTS) ×2 IMPLANT
NEEDLE 25GX 5/8IN NON SAFETY (NEEDLE) ×2 IMPLANT
NS IRRIG 1000ML POUR BTL (IV SOLUTION) ×2 IMPLANT
PACK ORTHO CERVICAL (CUSTOM PROCEDURE TRAY) ×2 IMPLANT
PAD ARMBOARD 7.5X6 YLW CONV (MISCELLANEOUS) ×4 IMPLANT
PIN TEMP FIXATION SMOOTH SU (EXFIX) ×2 IMPLANT
PLATE ANT CERV XTEND 1 LV 14 (Plate) ×2 IMPLANT
POSITIONER HEAD DONUT 9IN (MISCELLANEOUS) ×2 IMPLANT
SCREW XTD VAR 4.2 SELF TAP (Screw) ×8 IMPLANT
STRIP CLOSURE SKIN 1/2X4 (GAUZE/BANDAGES/DRESSINGS) ×2 IMPLANT
SURGIFLO W/THROMBIN 8M KIT (HEMOSTASIS) ×2 IMPLANT
SUT BONE WAX W31G (SUTURE) ×2 IMPLANT
SUT VIC AB 3-0 PS2 18 (SUTURE) ×1
SUT VIC AB 3-0 PS2 18XBRD (SUTURE) ×1 IMPLANT
SUT VIC AB 4-0 PS2 27 (SUTURE) ×2 IMPLANT
SYR BULB EAR ULCER 3OZ GRN STR (SYRINGE) ×2 IMPLANT
TAPE CLOTH SURG 4X10 WHT LF (GAUZE/BANDAGES/DRESSINGS) ×2 IMPLANT
TOWEL GREEN STERILE (TOWEL DISPOSABLE) ×2 IMPLANT
TOWEL GREEN STERILE FF (TOWEL DISPOSABLE) ×2 IMPLANT
WATER STERILE IRR 1000ML POUR (IV SOLUTION) ×2 IMPLANT

## 2021-04-13 NOTE — Anesthesia Postprocedure Evaluation (Signed)
Anesthesia Post Note  Patient: LONNIE HORNBACK  Procedure(s) Performed: C5-6 ANTERIOR CERVICAL DISCECTOMY FUSION, ALLOGRAFT, PLATE     Patient location during evaluation: PACU Anesthesia Type: General Level of consciousness: awake and alert, oriented and patient cooperative Pain management: pain level controlled Vital Signs Assessment: post-procedure vital signs reviewed and stable Respiratory status: spontaneous breathing, nonlabored ventilation and respiratory function stable Cardiovascular status: blood pressure returned to baseline and stable Postop Assessment: no apparent nausea or vomiting Anesthetic complications: no   No notable events documented.  Last Vitals:  Vitals:   04/13/21 1536 04/13/21 1557  BP: 120/81 (!) 136/91  Pulse: 79 80  Resp: 14 18  Temp: (!) 36.2 C 36.6 C  SpO2: 95% 98%    Last Pain:  Vitals:   04/13/21 1629  TempSrc:   PainSc: [place]

## 2021-04-13 NOTE — Anesthesia Procedure Notes (Addendum)
Procedure Name: Intubation Date/Time: 04/13/2021 12:32 PM Performed by: Michele Rockers, CRNA Pre-anesthesia Checklist: Patient identified, Patient being monitored, Timeout performed, Emergency Drugs available and Suction available Patient Re-evaluated:Patient Re-evaluated prior to induction Oxygen Delivery Method: Circle System Utilized Preoxygenation: Pre-oxygenation with 100% oxygen Induction Type: IV induction Ventilation: Mask ventilation without difficulty Laryngoscope Size: Miller and 2 Grade View: Grade I Tube type: Oral Tube size: 7.5 mm Number of attempts: 1 Airway Equipment and Method: Stylet Placement Confirmation: ETT inserted through vocal cords under direct vision, positive ETCO2 and breath sounds checked- equal and bilateral Secured at: 21 cm Tube secured with: Tape Dental Injury: Teeth and Oropharynx as per pre-operative assessment

## 2021-04-13 NOTE — Op Note (Signed)
Preop diagnosis: C5-6 right paracentral disc protrusion with radiculopathy.  Postop diagnosis: Same  Procedure: C5-6 anterior cervical discectomy and fusion, allograft and plate.  Surgeon: Rodell Perna MD  Anesthesia: General orotracheal +6 cc Marcaine skin local.  Assistant: April Fulp, RNFA  EBL: Less than 100 cc  Drains 1 Hemovac neck.  Implants:Implants   BONE CC-ACS [id number] - [id number]  Inventory Item: BONE CC-ACS [id number] 6D Serial no.: [id number] Model/Cat no.: [id number]  Implant name: BONE CC-ACS [id number] - [id number] Laterality: N/A Area: Cervical Level 5-6  Manufacturer: Gerilyn Nestle FNDN Date of Manufacture:    Action: Implanted Number Used: 1   Device Identifier: [id number] Device Identifier Type:     PLATE ANT CERV XTEND 1 LV 14 - [id number]  Inventory Item: PLATE ANT CERV XTEND 1 LV 14 Serial no.:  Model/Cat no.[id number]  Implant name: PLATE ANT CERV XTEND 1 LV 14 - [id number] Laterality: N/A Area: Cervical Level 5-6  Manufacturer: [place] Date of Manufacture:    Action: Implanted Number Used: 1   Device Identifier:  Device Identifier Type:     SCREW XTD VAR 4.2 SELF TAP - [id number]  Inventory Item: SCREW XTD VAR 4.2 SELF TAP Serial no.:  Model/Cat no.[id number]  Implant name: SCREW XTD VAR 4.2 SELF TAP - [id number] Laterality: N/A Area: Cervical Level 5-6  Manufacturer: [place] Date of Manufacture:    Action: Implanted Number Used: 4   Device Identifier:  Device Identifier Type:    Procedure: After orotracheal ovation had ultra traction without weights arm tucked the sides yellow pads over the ulnar nerve no restraints were necessary due to his long thin neck.  Next prepped with DuraPrep there is squared with gel sterile skin marker Betadine Steri-Drape application sterile Mayo stand at the head and thyroid sheets and drapes timeout procedure completed Ancef prophylaxis.  Incision was made starting at the midline extending to the  left platysma was divided in line with the skin incision.  Blunt dissection down the longus Coley short 25 needle placed and crosstable C arm showed that this was at the C6-7 space.  We slid 1 level up to C5-6 and opened up the disc with a 15 scalpel blade to Oland Arquette it and removed some disc material with regular pituitary.  Cloward blades were placed right and left smooth blade cephalad caudad.  Discectomy was performed.  Operative microscope was brought in we progressed down to the posterior longitudinal ligament where there were extruded fragments right paracentral causing compression of the right side of the cord.  Once these were removed decompression of the uncovertebral joints right and left with the aggressive fluid space allowed on each side for a trial 6 and 7 graft sizer.  7 gave appropriate level.  With good decompression of the dura uncovertebral joint stripped right and left and all spurs removed 7 mm graft was countersunk 1 mm.  Some anterior spurs were smoothed off of the plate would sit down flat and the globus XTEND plate was selected 14 mm size.  14 mm screws x4.  Final C-arm pictures were taken and screws were locked in with a tiny screwdriver.  Hemovac placed within and out technique along with skin incision.  Platysma closed with 3-0 Vicryl 4-0 Vicryl subcuticular closure Marcaine infiltration 6 cc tincture benzoin Steri-Strips postop dressing soft collar and extubation transfer recovery room in stable condition with good relief of preop right arm pain and numbness.

## 2021-04-13 NOTE — Progress Notes (Signed)
Orthopedic Tech Progress Note Patient Details:  Luke Mcgrath [birth year] [id number] Soft Philly Collar was given to patient for use when he showers.  Ortho Devices Type of Ortho Device: Soft collar Ortho Device/Splint Location: Neck Ortho Device/Splint Interventions: Ordered      Luke Mcgrath 04/13/2021, 4:18 PM

## 2021-04-13 NOTE — Interval H&P Note (Signed)
History and Physical Interval Note:  04/13/2021 12:24 PM  [name]  has presented today for surgery, with the diagnosis of C5-6 herniated nucleus pulposus with radiculopathy.  The various methods of treatment have been discussed with the patient and family. After consideration of risks, benefits and other options for treatment, the patient has consented to  Procedure(s): C5-6 ANTERIOR CERVICAL DISCECTOMY FUSION, ALLOGRAFT, PLATE (N/A) as a surgical intervention.  The patient's history has been reviewed, patient examined, no change in status, stable for surgery.  I have reviewed the patient's chart and labs.  Questions were answered to the patient's satisfaction.     Luke Mcgrath

## 2021-04-13 NOTE — Transfer of Care (Signed)
Immediate Anesthesia Transfer of Care Note  Patient: Luke Mcgrath  Procedure(s) Performed: C5-6 ANTERIOR CERVICAL DISCECTOMY FUSION, ALLOGRAFT, PLATE  Patient Location: PACU  Anesthesia Type:General  Level of Consciousness: drowsy, patient cooperative and responds to stimulation  Airway & Oxygen Therapy: Patient Spontanous Breathing and Patient connected to nasal cannula oxygen  Post-op Assessment: Report given to RN and Post -op Vital signs reviewed and stable  Post vital signs: Reviewed and stable  Last Vitals:  Vitals Value Taken Time  BP 135/82 04/13/21 1438  Temp    Pulse 94 04/13/21 1439  Resp 14 04/13/21 1439  SpO2 98 % 04/13/21 1439  Vitals shown include unvalidated device data.  Last Pain:  Vitals:   04/13/21 1128  TempSrc:   PainSc: 4       Patients Stated Pain Goal: 2 ([id number])  Complications: No notable events documented.

## 2021-04-13 NOTE — Anesthesia Preprocedure Evaluation (Addendum)
Anesthesia Evaluation  Patient identified by MRN, date of birth, ID band Patient awake    Reviewed: Allergy & Precautions, NPO status , Patient's Chart, lab work & pertinent test results  Airway Mallampati: I  TM Distance: >3 FB Neck ROM: Full    Dental no notable dental hx. (+) Teeth Intact, Dental Advisory Given   Pulmonary neg pulmonary ROS,    Pulmonary exam normal breath sounds clear to auscultation       Cardiovascular negative cardio ROS Normal cardiovascular exam Rhythm:Regular Rate:Normal     Neuro/Psych PSYCHIATRIC DISORDERS Anxiety  Neuromuscular disease    GI/Hepatic Neg liver ROS, GERD  Medicated and Controlled,  Endo/Other  negative endocrine ROS  Renal/GU negative Renal ROS  negative genitourinary   Musculoskeletal negative musculoskeletal ROS (+)   Abdominal   Peds  Hematology negative hematology ROS (+)   Anesthesia Other Findings   Reproductive/Obstetrics negative OB ROS                           Anesthesia Physical Anesthesia Plan  ASA: 2  Anesthesia Plan: General   Post-op Pain Management:    Induction: Intravenous  PONV Risk Score and Plan: Ondansetron, Dexamethasone, Midazolam and Treatment may vary due to age or medical condition  Airway Management Planned: Oral ETT and Video Laryngoscope Planned  Additional Equipment: None  Intra-op Plan:   Post-operative Plan: Extubation in OR  Informed Consent: I have reviewed the patients History and Physical, chart, labs and discussed the procedure including the risks, benefits and alternatives for the proposed anesthesia with the patient or authorized representative who has indicated his/her understanding and acceptance.     Dental advisory given  Plan Discussed with: CRNA  Anesthesia Plan Comments:        Anesthesia Quick Evaluation

## 2021-04-13 NOTE — H&P (Signed)
Patient: Luke Mcgrath                                       Date of Birth: 08/05/1986                                                    MRN: [id number] Visit Date: 03/21/2021                                                                     Requested by: Eulas Post, MD [place],  [place] [address] PCP: Eulas Post, MD     Assessment & Plan: Visit Diagnoses:  1. Protrusion of cervical intervertebral disc       Plan: Patient has C5-6 right posterior lateral disc protrusion compressing the right C6 nerve with radiculopathy.  Failed treatment with Flexeril, Neurontin, baclofen, chronic oxycodone usage.  Failed therapy exercise program and multiple chiropractic treatments.  Surgical treatment would be single level cervical fusion C5-6 anteriorly with overnight stay in the hospital.  We discussed use of allograft, plate, postoperative collar x6weeks.  Discussed out of work.  Risks of dysphagia dysphonia, low risk of pseudoarthrosis.  Questions elicited and answered he understands request to proceed.   Follow-Up Instructions: No follow-ups on file.    Orders:  No orders of the defined types were placed in this encounter.   No orders of the defined types were placed in this encounter.        Procedures: No procedures performed     Clinical Data: No additional findings.     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  Patient had previous surgery for a C6-7 laminectomy for a spinal cavernoma surgically treated at [facility] October 10, 2013.  Reimaging studies done 11/30/2017 showed intramedullary lesion spinal cord at C7 unchanged with solitary cavernous malformation.  Patient states the pain is gotten much more severe and new MRI scan was  obtained on 03/17/2021 and is available for review.  Patient has been treated with medication for his neck without relief.  He is on chronic oxycodone 5/325     120 tabs a month since his previous neck surgery.  No response to Flexeril, gabapentin.  Patient states that his pain is severe and is not able to tolerate the severe pain.  Patient denies a trauma or injury to his neck.  No shortness of breath no myelopathic symptoms.  Positive significant neck right greater than left shoulder pain severe pain with neck rotation.  He does have history of anxiety.   Review of Systems all other systems noncontributory to HPI other than as mentioned above.     Objective: Vital Signs: BP (!) 147/83   Pulse 86   Ht 6' (1.829  m)   Wt 165 lb (74.8 kg)   BMI 22.38 kg/m    Physical Exam Constitutional:      Appearance: He is well-developed. HENT:    Head: Normocephalic and atraumatic.    Right Ear: External ear normal.    Left Ear: External ear normal. Eyes:    Pupils: Pupils are equal, round, and reactive to light. Neck:    Thyroid: No thyromegaly.    Trachea: No tracheal deviation. Cardiovascular:    Rate and Rhythm: Normal rate. Pulmonary:    Effort: Pulmonary effort is normal.    Breath sounds: No wheezing. Abdominal:    General: Bowel sounds are normal.    Palpations: Abdomen is soft. Musculoskeletal:    Cervical back: Neck supple. Skin:    General: Skin is warm and dry.    Capillary Refill: Capillary refill takes less than 2 seconds. Neurological:    Mental Status: He is alert and oriented to person, place, and time. Psychiatric:        Behavior: Behavior normal.        Thought Content: Thought content normal.        Judgment: Judgment normal.     Ortho Exam well-healed posterior incision.  He has severe brachial plexus tenderness on the right positive Spurling on the right negative on the left.  Decreased sensation radial side of his right hand.  He has wrist extension weakness on  the right normal on the left.   Specialty Comments:  No specialty comments available.   Imaging: Narrative & Impression  CLINICAL DATA:  Spinal stenosis. Paraspinal mass/tumor. Worsening neck pain. History of cervical cavernoma.   EXAM: MRI CERVICAL SPINE WITHOUT CONTRAST   TECHNIQUE: Multiplanar, multisequence MR imaging of the cervical spine was performed. No intravenous contrast was administered.   COMPARISON:  Radiography 03/16/2021.  MRI 11/30/2017.   FINDINGS: The study suffers from moderate motion degradation.   Alignment: Normal   Vertebrae: No vertebral body abnormality.   Cord: Cavernoma within the right side of the cord at the C6-7 and C7 level as seen previously. No discernible change.   Posterior Fossa, vertebral arteries, paraspinal tissues: Negative   Disc levels:   Foramen magnum is widely patent.  C1-2 is normal.   C2-3: Mild left-sided uncovertebral prominence. No compressive canal or foraminal narrowing.   C3-4: Endplate osteophytes and mild bulging of the disc. No compressive narrowing of the canal or foramina.   C4-5: Minimal disc bulge.  No canal or foraminal stenosis.   C5-6: Right posterolateral to foraminal disc herniation likely to compress the right C6 nerve. No compression of cord.   C6-7: Minimal bulging of the disc.  No compressive stenosis.   C7-T1: Normal interspace.   IMPRESSION: Right posterolateral to foraminal disc herniation at C5-6 likely to compress the right C6 nerve.   Mild degenerative spondylosis at C2-3, C3-4, C4-5 and C6-7, slightly worsened since 2019 but without apparent neural compression.   Cavernoma within the right side of the cord at the C6-7 and C7 levels, grossly unchanged since the study of 2019. Today's exam suffers from some motion degradation and detail of this lesion is somewhat limited.     Electronically Signed   By: Nelson Chimes M.D.   On: 03/17/2021 16:07            PMFS History:      Patient Active Problem List    Diagnosis Date Noted   Protrusion of cervical intervertebral disc 03/25/2021   Carpal tunnel syndrome of  left wrist 01/22/2018   Carpal tunnel syndrome of right wrist 01/22/2018   Prurigo nodularis 06/18/2017   GERD (gastroesophageal reflux disease) 10/06/2013   Arteriovenous fistula of spinal cord vessels 08/01/2013   Neuralgia of left thigh 07/01/2013   Chronic anxiety 07/01/2013        Past Medical History:  Diagnosis Date   Anxiety     Cavernous malformation 09/2013    Cervical region         Family History  Problem Relation Age of Onset   Healthy Mother     Hyperlipidemia Father     Hypertension Father     Diabetes Father     Cancer Maternal Grandmother          colon   Hyperlipidemia Maternal Grandmother     Other Brother          Died, Nature conservation officer services in [place]   Healthy Sister           Past Surgical History:  Procedure Laterality Date   BRAIN SURGERY   1990    age 36   CERVICAL LAMINECTOMY FOR EXCISION OF NEOPLASM   2015    Cervical cavernoma at C6-7   RECONSTRUCTION OF NOSE   1994    Social History         Occupational History   Not on file  Tobacco Use   Smoking status: Never   Smokeless tobacco: Never  Vaping Use   Vaping Use: Never used  Substance and Sexual Activity   Alcohol use: Yes      Comment: occasionally   Drug use: Not Currently      Types: Marijuana   Sexual activity: Not on file

## 2021-04-14 ENCOUNTER — Encounter (HOSPITAL_COMMUNITY): Payer: Self-pay | Admitting: Orthopaedic Surgery

## 2021-04-14 DIAGNOSIS — M50122 Cervical disc disorder at C5-C6 level with radiculopathy: Secondary | ICD-10-CM | POA: Diagnosis not present

## 2021-04-14 LAB — CBC
HCT: 45 % (ref 39.0–52.0)
Hemoglobin: 15.8 g/dL (ref 13.0–17.0)
MCH: 31.3 pg (ref 26.0–34.0)
MCHC: 35.1 g/dL (ref 30.0–36.0)
MCV: 89.1 fL (ref 80.0–100.0)
Platelets: 256 10*3/uL (ref 150–400)
RBC: 5.05 MIL/uL (ref 4.22–5.81)
RDW: 11.5 % (ref 11.5–15.5)
WBC: 11.9 10*3/uL — ABNORMAL HIGH (ref 4.0–10.5)
nRBC: 0 % (ref 0.0–0.2)

## 2021-04-14 LAB — BASIC METABOLIC PANEL
Anion gap: 14 (ref 5–15)
BUN: 16 mg/dL (ref 6–20)
CO2: 25 mmol/L (ref 22–32)
Calcium: 10.3 mg/dL (ref 8.9–10.3)
Chloride: 97 mmol/L — ABNORMAL LOW (ref 98–111)
Creatinine, Ser: 0.89 mg/dL (ref 0.61–1.24)
GFR, Estimated: >60 mL/min (ref >60)
Glucose, Bld: 132 mg/dL — ABNORMAL HIGH (ref 70–99)
Potassium: 4.6 mmol/L (ref 3.5–5.1)
Sodium: 136 mmol/L (ref 135–145)

## 2021-04-14 MED ORDER — OXYCODONE-ACETAMINOPHEN 10-325 MG PO TABS: 1.0000 | ORAL_TABLET | Freq: Four times a day (QID) | ORAL | 0 refills | Status: DC | PRN

## 2021-04-14 NOTE — Evaluation (Signed)
Occupational Therapy Evaluation Patient Details Name: Luke Mcgrath MRN: [id number] DOB: 19-Jan-1986 Today's Date: 04/14/2021    History of Present Illness Patient s/p C5-6 ANTERIOR CERVICAL DISCECTOMY FUSION   Clinical Impression   Mr. Aaqil Massucci is a 35 year old man s/p ACDF surgery who presents with pain and decreased knowledge of cervical precautions. Therapist instructed patient on precautions, lifting limit and compensatory strategies for ADLs. Patient verbalized understanding. Patient demonstrated independence with mobility and ADLs. Needed verbal cues to maintain precautions and therapist cautioned patient to move slower. Patient's significant other in room to verbalize understanding as well and to help remind patient of movement limitations. No further OT needs at this time.    Follow Up Recommendations  No OT follow up    Equipment Recommendations  None recommended by OT    Recommendations for Other Services       Precautions / Restrictions Precautions Precautions: Cervical Precaution Booklet Issued:  (handout) Precaution Comments: soft collar at all times Restrictions Weight Bearing Restrictions: No Other Position/Activity Restrictions: 5 Lb weight lifting restriction      Mobility Bed Mobility Overal bed mobility: Independent                  Transfers Overall transfer level: Independent                    Balance                                           ADL either performed or assessed with clinical judgement   ADL Overall ADL's : Modified independent                                       General ADL Comments: Demonstrates ability to perform ADLs after cervical precaution instructions. Needed verbal cues to not bend over and slow down for safety. Therapist educated patient on compensatory strategies for ADLs.     Vision Patient Visual Report: No change from baseline       Perception      Praxis      Pertinent Vitals/Pain Pain Assessment: 0-10 Pain Score: 4  Pain Location: anterior neck Pain Descriptors / Indicators: Aching Pain Intervention(s): Monitored during session     Hand Dominance     Extremity/Trunk Assessment Upper Extremity Assessment Upper Extremity Assessment: Overall WFL for tasks assessed   Lower Extremity Assessment Lower Extremity Assessment: Defer to PT evaluation   Cervical / Trunk Assessment Cervical / Trunk Assessment: Other exceptions Cervical / Trunk Exceptions: s/p cervical surgery, with soft collar, appears functional   Communication     Cognition Arousal/Alertness: Awake/alert Behavior During Therapy: WFL for tasks assessed/performed Overall Cognitive Status: Within Functional Limits for tasks assessed                                     General Comments       Exercises     Shoulder Instructions      Home Living Family/patient expects to be discharged to:: Private residence Living Arrangements: Spouse/significant other  Prior Functioning/Environment                   OT Problem List: Pain;Decreased knowledge of precautions      OT Treatment/Interventions:      OT Goals(Current goals can be found in the care plan section) Acute Rehab OT Goals OT Goal Formulation: All assessment and education complete, DC therapy  OT Frequency:     Barriers to D/C:            Co-evaluation              AM-PAC OT "6 Clicks" Daily Activity     Outcome Measure Help from another person eating meals?: None Help from another person taking care of personal grooming?: None Help from another person toileting, which includes using toliet, bedpan, or urinal?: None Help from another person bathing (including washing, rinsing, drying)?: None Help from another person to put on and taking off regular upper body clothing?: None Help from another person to put on  and taking off regular lower body clothing?: None 6 Click Score: 24   End of Session Nurse Communication: Mobility status (no further OT needs)  Activity Tolerance: Patient tolerated treatment well Patient left: in bed  OT Visit Diagnosis: Pain                Time: [id number] OT Time Calculation (min): 21 min Charges:  OT General Charges $OT Visit: 1 Visit OT Evaluation $OT Eval Low Complexity: 1 Low  Lian Tanori, OTR/L [place]  Office [phone] Pager: [place] 04/14/2021, 8:34 AM

## 2021-04-14 NOTE — Plan of Care (Signed)
Adequately Ready for Discharge 

## 2021-04-14 NOTE — Discharge Instructions (Signed)
Walk daily gradually work your way up to 2 miles a day.  Keep collar on at all times except when you switch it to the shower collar that is wrapped in Saran wrap.  No lifting more than 5 pounds.  See Dr. Lorin Mercy in 1 week.  Prescription for pain medication Percocet 10/325 was sent into her pharmacy.

## 2021-04-14 NOTE — Progress Notes (Signed)
Patient ID: Luke Mcgrath, male   DOB: [birth year], 35 y.o.   MRN: [id number]   Subjective: 1 Day Post-Op Procedure(s) (LRB): C5-6 ANTERIOR CERVICAL DISCECTOMY FUSION, ALLOGRAFT, PLATE (N/A) Patient reports pain as mild and moderate.    Objective: Vital signs in last 24 hours: Temp:  [97.2 F (36.2 C)-98.3 F (36.8 C)] 98 F (36.7 C) (08/04 0740) Pulse Rate:  [57-91] 71 (08/04 0740) Resp:  [13-20] 18 (08/04 0740) BP: (118-147)/(74-94) 147/89 (08/04 0740) SpO2:  [93 %-98 %] 96 % (08/04 0740) Weight:  [74.8 kg] 74.8 kg (08/03 1030)  Intake/Output from previous day: 08/03 0701 - 08/04 0700 In: 800 [I.V.:700; IV Piggyback:100] Out: 20 [Blood:20] Intake/Output this shift: No intake/output data recorded.  Recent Labs    04/13/21 1136 04/14/21 0253  HGB 15.7 15.8   Recent Labs    04/13/21 1136 04/14/21 0253  WBC 5.2 11.9*  RBC 5.03 5.05  HCT 45.3 45.0  PLT 239 256   Recent Labs    04/13/21 1137 04/14/21 0253  NA 137 136  K 3.8 4.6  CL 104 97*  CO2 23 25  BUN 12 16  CREATININE 0.82 0.89  GLUCOSE 97 132*  CALCIUM 9.2 10.3   No results for input(s): LABPT, INR in the last 72 hours.  Neurologically intact DG Cervical Spine 2 or 3 views  Result Date: 04/13/2021 CLINICAL DATA:  Surgery, elective Z41.9 (ICD-10-CM). Additional history provided by technologist: C5-6 anterior cervical discectomy fusion, allograft, plate. Provided fluoroscopy time 10 seconds (0.88 mGy). EXAM: CERVICAL SPINE - 2-3 VIEW; DG C-ARM 1-60 MIN COMPARISON:  Cervical spine MRI 03/17/2021. FINDINGS: PA and lateral view intraoperative fluoroscopic images of the cervical spine are submitted, 2 images total. On the provided images, ACDF hardware is present at the C5-C6 level (ventral plate and screws, as well as interbody device). Redemonstrated hardware along the posterior elements at C6 and C7. Partially visualized ET tube. IMPRESSION: Two intraoperative fluoroscopic images of the cervical spine from  C5-C6 ACDF. Electronically Signed   By: Kellie Simmering DO   On: 04/13/2021 15:13   DG C-Arm 1-60 Min  Result Date: 04/13/2021 CLINICAL DATA:  Surgery, elective Z41.9 (ICD-10-CM). Additional history provided by technologist: C5-6 anterior cervical discectomy fusion, allograft, plate. Provided fluoroscopy time 10 seconds (0.88 mGy). EXAM: CERVICAL SPINE - 2-3 VIEW; DG C-ARM 1-60 MIN COMPARISON:  Cervical spine MRI 03/17/2021. FINDINGS: PA and lateral view intraoperative fluoroscopic images of the cervical spine are submitted, 2 images total. On the provided images, ACDF hardware is present at the C5-C6 level (ventral plate and screws, as well as interbody device). Redemonstrated hardware along the posterior elements at C6 and C7. Partially visualized ET tube. IMPRESSION: Two intraoperative fluoroscopic images of the cervical spine from C5-C6 ACDF. Electronically Signed   By: Kellie Simmering DO   On: 04/13/2021 15:13    Assessment/Plan: 1 Day Post-Op Procedure(s) (LRB): C5-6 ANTERIOR CERVICAL DISCECTOMY FUSION, ALLOGRAFT, PLATE (N/A) Plan : discharge home  Luke Mcgrath 04/14/2021, 8:11 AM

## 2021-04-14 NOTE — Evaluation (Signed)
Physical Therapy Evaluation and Discharge Patient Details Name: Luke Mcgrath MRN: [id number] DOB: 1985-10-22 Today's Date: 04/14/2021   History of Present Illness  Pt is a 35 y/o male who presents s/p C5-C6 ACDF on 04/13/2021. PMH significant for Brain surgery 1990, laminectomy for excision of cervical neoplasm 2015.   Clinical Impression  Patient evaluated by Physical Therapy with no further acute PT needs identified. All education has been completed and the patient has no further questions. Pt was able to demonstrate transfers and ambulation with independence and no AD. Pt was educated on precautions, brace application/wearing schedule, appropriate activity progression, and car transfer. See below for any follow-up Physical Therapy or equipment needs. PT is signing off. Thank you for this referral.     Follow Up Recommendations Outpatient PT (Work conditioning program)    Equipment Recommendations  None recommended by PT    Recommendations for Other Services       Precautions / Restrictions Precautions Precautions: Cervical Precaution Booklet Issued: Yes (comment) Precaution Comments: Reviewed handout and pt was cued for precautions during functional mobility. Required Braces or Orthoses: Cervical Brace Cervical Brace: Soft collar Restrictions Weight Bearing Restrictions: No Other Position/Activity Restrictions: 5 Lb weight lifting restriction      Mobility  Bed Mobility Overal bed mobility: Independent             General bed mobility comments: Good log roll technique from flat bed and no rails to simulate home environment.    Transfers Overall transfer level: Independent Equipment used: None                Ambulation/Gait Ambulation/Gait assistance: Independent Gait Distance (Feet): 400 Feet Assistive device: None Gait Pattern/deviations: WFL(Within Functional Limits)        Stairs Stairs: Yes Stairs assistance: Independent Stair Management: No  rails Number of Stairs: 10    Wheelchair Mobility    Modified Rankin (Stroke Patients Only)       Balance Overall balance assessment: Independent                                           Pertinent Vitals/Pain Pain Assessment: 0-10 Pain Score: 4  Pain Location: anterior neck Pain Descriptors / Indicators: Aching Pain Intervention(s): Limited activity within patient's tolerance;Monitored during session;Repositioned    Home Living Family/patient expects to be discharged to:: Private residence Living Arrangements: Spouse/significant other Available Help at Discharge: Friend(s);Available PRN/intermittently Type of Home: House Home Access: Stairs to enter   [employer] of Steps: 5 Home Layout: One level Home Equipment: None      Prior Function Level of Independence: Independent         Comments: Works Marketing executive        Extremity/Trunk Assessment   Upper Extremity Assessment Upper Extremity Assessment: Overall WFL for tasks assessed    Lower Extremity Assessment Lower Extremity Assessment: Overall WFL for tasks assessed    Cervical / Trunk Assessment Cervical / Trunk Assessment: Other exceptions Cervical / Trunk Exceptions: s/p cervical surgery  Communication   Communication: No difficulties  Cognition Arousal/Alertness: Awake/alert Behavior During Therapy: WFL for tasks assessed/performed Overall Cognitive Status: Within Functional Limits for tasks assessed  General Comments      Exercises     Assessment/Plan    PT Assessment Patent does not need any further PT services  PT Problem List         PT Treatment Interventions      PT Goals (Current goals can be found in the Care Plan section)  Acute Rehab PT Goals Patient Stated Goal: Back to work in 6 weeks PT Goal Formulation: All assessment and education complete,  DC therapy    Frequency     Barriers to discharge        Co-evaluation               AM-PAC PT "6 Clicks" Mobility  Outcome Measure Help needed turning from your back to your side while in a flat bed without using bedrails?: None Help needed moving from lying on your back to sitting on the side of a flat bed without using bedrails?: None Help needed moving to and from a bed to a chair (including a wheelchair)?: None Help needed standing up from a chair using your arms (e.g., wheelchair or bedside chair)?: None Help needed to walk in hospital room?: None Help needed climbing 3-5 steps with a railing? : None 6 Click Score: 24    End of Session Equipment Utilized During Treatment: Cervical collar Activity Tolerance: Patient tolerated treatment well Patient left: in bed;with call bell/phone within reach;with family/visitor present Nurse Communication: Mobility status PT Visit Diagnosis: Pain Pain - part of body:  (neck)    Time: [id number] PT Time Calculation (min) (ACUTE ONLY): 16 min   Charges:   PT Evaluation $PT Eval Low Complexity: 1 Low          Luke Mcgrath, PT, DPT Acute Rehabilitation Services Pager: [phone] Office: [phone]   Thelma Comp 04/14/2021, 9:47 AM

## 2021-04-14 NOTE — Progress Notes (Signed)
Patient alert and oriented, voiding adequately, skin clean, dry and intact without evidence of skin break down, or symptoms of complications - no redness or edema noted, only slight tenderness at site.  Patient states pain is manageable at time of discharge. Patient has an appointment with MD in 1 weeks

## 2021-04-18 NOTE — Discharge Summary (Signed)
Patient ID: Luke Mcgrath MRN: [id number] DOB/AGE: [birth year] 37 y.o.  Admit date: 04/13/2021 Discharge date: 04/14/2021  Admission Diagnoses:  Active Problems:   History of fusion of cervical spine   Discharge Diagnoses:  Active Problems:   History of fusion of cervical spine  status post Procedure(s): C5-6 ANTERIOR CERVICAL DISCECTOMY FUSION, ALLOGRAFT, PLATE  Past Medical History:  Diagnosis Date   Anxiety    Cavernous malformation 09/11/2013   Cervical region   GERD (gastroesophageal reflux disease)     Surgeries: Procedure(s): C5-6 ANTERIOR CERVICAL DISCECTOMY FUSION, ALLOGRAFT, PLATE on [id number]   Consultants:   Discharged Condition: Improved  Hospital Course: Luke Mcgrath is an 35 y.o. male who was admitted 04/13/2021 for operative treatment of cervical stenosis/HNP. Patient failed conservative treatments (please see the history and physical for the specifics) and had severe unremitting pain that affects sleep, daily activities and work/hobbies. After pre-op clearance, the patient was taken to the operating room on 04/13/2021 and underwent  Procedure(s): C5-6 ANTERIOR CERVICAL DISCECTOMY FUSION, ALLOGRAFT, PLATE.    Patient was given perioperative antibiotics:  Anti-infectives (From admission, onward)    Start     Dose/Rate Route Frequency Ordered Stop   04/13/21 1130  ceFAZolin (ANCEF) IVPB 2g/100 mL premix        2 g 200 mL/hr over 30 Minutes Intravenous On call to O.R. 04/13/21 1116 04/13/21 1245        Patient was given sequential compression devices and early ambulation to prevent DVT.   Patient benefited maximally from hospital stay and there were no complications. At the time of discharge, the patient was urinating/moving their bowels without difficulty, tolerating a regular diet, pain is controlled with oral pain medications and they have been cleared by PT/OT.   Recent vital signs: No data found.   Recent laboratory studies: No results for  input(s): WBC, HGB, HCT, PLT, NA, K, CL, CO2, BUN, CREATININE, GLUCOSE, INR, CALCIUM in the last 72 hours.  Invalid input(s): PT, 2   Discharge Medications:   Allergies as of 04/14/2021   No Known Allergies      Medication List     STOP taking these medications    cyclobenzaprine 10 MG tablet Commonly known as: FLEXERIL   omeprazole 20 MG capsule Commonly known as: PRILOSEC       TAKE these medications    CAL-MAG-ZINC PO Take 3 tablets by mouth every 14 (fourteen) days.   gabapentin 300 MG capsule Commonly known as: NEURONTIN Take 300 mg by mouth 3 (three) times daily.   ibuprofen 200 MG tablet Commonly known as: ADVIL Take 600-800 mg by mouth every 8 (eight) hours as needed for headache or moderate pain.   oxyCODONE-acetaminophen 5-325 MG tablet Commonly known as: PERCOCET/ROXICET Take 1 tablet by mouth 4 (four) times daily as needed for moderate pain. What changed: Another medication with the same name was added. Make sure you understand how and when to take each.   oxyCODONE-acetaminophen 10-325 MG tablet Commonly known as: Percocet Take 1 tablet by mouth every 6 (six) hours as needed for pain. What changed: You were already taking a medication with the same name, and this prescription was added. Make sure you understand how and when to take each.   VITAMIN C PO Take 1 tablet by mouth daily.   VITAMIN D PO Take 1 tablet by mouth daily.        Diagnostic Studies: DG Cervical Spine 2 or 3 views  Result Date: 04/13/2021 CLINICAL DATA:  Surgery, elective Z41.9 (ICD-10-CM). Additional history provided by technologist: C5-6 anterior cervical discectomy fusion, allograft, plate. Provided fluoroscopy time 10 seconds (0.88 mGy). EXAM: CERVICAL SPINE - 2-3 VIEW; DG C-ARM 1-60 MIN COMPARISON:  Cervical spine MRI 03/17/2021. FINDINGS: PA and lateral view intraoperative fluoroscopic images of the cervical spine are submitted, 2 images total. On the provided images, ACDF  hardware is present at the C5-C6 level (ventral plate and screws, as well as interbody device). Redemonstrated hardware along the posterior elements at C6 and C7. Partially visualized ET tube. IMPRESSION: Two intraoperative fluoroscopic images of the cervical spine from C5-C6 ACDF. Electronically Signed   By: Kellie Simmering DO   On: 04/13/2021 15:13   DG C-Arm 1-60 Min  Result Date: 04/13/2021 CLINICAL DATA:  Surgery, elective Z41.9 (ICD-10-CM). Additional history provided by technologist: C5-6 anterior cervical discectomy fusion, allograft, plate. Provided fluoroscopy time 10 seconds (0.88 mGy). EXAM: CERVICAL SPINE - 2-3 VIEW; DG C-ARM 1-60 MIN COMPARISON:  Cervical spine MRI 03/17/2021. FINDINGS: PA and lateral view intraoperative fluoroscopic images of the cervical spine are submitted, 2 images total. On the provided images, ACDF hardware is present at the C5-C6 level (ventral plate and screws, as well as interbody device). Redemonstrated hardware along the posterior elements at C6 and C7. Partially visualized ET tube. IMPRESSION: Two intraoperative fluoroscopic images of the cervical spine from C5-C6 ACDF. Electronically Signed   By: Kellie Simmering DO   On: 04/13/2021 15:13    Discharge Instructions     Incentive spirometry RT   Complete by: As directed         Follow-up Information     [name], MD Follow up in 1 week(s).   Specialty: Orthopedic Surgery Contact information: [address] [place] [address] [phone]                 Discharge Plan:  discharge to home  Disposition:     Signed: Benjiman Core  04/18/2021, 3:24 PM

## 2021-04-20 ENCOUNTER — Other Ambulatory Visit: Payer: Self-pay

## 2021-04-20 ENCOUNTER — Ambulatory Visit (INDEPENDENT_AMBULATORY_CARE_PROVIDER_SITE_OTHER): Payer: BC Managed Care – PPO

## 2021-04-20 ENCOUNTER — Ambulatory Visit (INDEPENDENT_AMBULATORY_CARE_PROVIDER_SITE_OTHER): Payer: BC Managed Care – PPO | Admitting: Orthopaedic Surgery

## 2021-04-20 ENCOUNTER — Encounter: Payer: Self-pay | Admitting: Orthopaedic Surgery

## 2021-04-20 VITALS — BP 133/93 | HR 78

## 2021-04-20 DIAGNOSIS — Z981 Arthrodesis status: Secondary | ICD-10-CM

## 2021-04-20 DIAGNOSIS — M542 Cervicalgia: Secondary | ICD-10-CM

## 2021-04-20 NOTE — Progress Notes (Signed)
   Post-Op Visit Note   Patient: Luke Mcgrath           Date of Birth: [birth year]           MRN: [id number] Visit Date: 04/20/2021 PCP: Eulas Post, MD   Assessment & Plan postop C5-6 ACDF on 04/13/2021.  He has noticed some dysphagia as expected.  Chief Complaint:  Chief Complaint  Patient presents with   Neck - Pain   Visit Diagnoses:  1. Neck pain   2. History of fusion of cervical spine     Plan: Patient was weaned off pain medication.  Steri-Strips changed.  Return office visit 5 weeks for lateral flexion-extension C-spine x-rays out of collar.  Follow-Up Instructions: No follow-ups on file.   Orders:  Orders Placed This Encounter  Procedures   XR Cervical Spine 2 or 3 views   No orders of the defined types were placed in this encounter.   Imaging: No results found.  PMFS History: Patient Active Problem List   Diagnosis Date Noted   History of fusion of cervical spine 04/13/2021   Protrusion of cervical intervertebral disc 03/25/2021   Carpal tunnel syndrome of left wrist 01/22/2018   Carpal tunnel syndrome of right wrist 01/22/2018   Prurigo nodularis 06/18/2017   GERD (gastroesophageal reflux disease) 10/06/2013   Arteriovenous fistula of spinal cord vessels 08/01/2013   Neuralgia of left thigh 07/01/2013   Chronic anxiety 07/01/2013   Past Medical History:  Diagnosis Date   Anxiety    Cavernous malformation 09/11/2013   Cervical region   GERD (gastroesophageal reflux disease)     Family History  Problem Relation Age of Onset   Healthy Mother    Hyperlipidemia Father    Hypertension Father    Diabetes Father    Cancer Maternal Grandmother        colon   Hyperlipidemia Maternal Grandmother    Other Brother        Died, Nature conservation officer services in [place]   Healthy Sister     Past Surgical History:  Procedure Laterality Date   ANTERIOR CERVICAL DECOMP/DISCECTOMY FUSION N/A 04/13/2021   Procedure: C5-6 ANTERIOR CERVICAL DISCECTOMY FUSION,  ALLOGRAFT, PLATE;  Surgeon: [name], MD;  Location: [place];  Service: Orthopedics;  Laterality: N/A;   BRAIN SURGERY  09/11/1988   age 68- reconstruction   CARPAL TUNNEL RELEASE Left 09/2018   CERVICAL LAMINECTOMY FOR EXCISION OF NEOPLASM  09/11/2013   Cervical cavernoma at C6-7   RECONSTRUCTION OF NOSE  09/11/1992   Social History   Occupational History   Not on file  Tobacco Use   Smoking status: Never   Smokeless tobacco: Never  Vaping Use   Vaping Use: Never used  Substance and Sexual Activity   Alcohol use: Yes    Comment: 7 drinks a week   Drug use: Not Currently    Types: Marijuana   Sexual activity: Not on file

## 2021-05-02 ENCOUNTER — Ambulatory Visit: Payer: BC Managed Care – PPO | Admitting: Family Medicine

## 2021-05-25 ENCOUNTER — Ambulatory Visit: Payer: Self-pay

## 2021-05-25 ENCOUNTER — Other Ambulatory Visit: Payer: Self-pay

## 2021-05-25 ENCOUNTER — Ambulatory Visit (INDEPENDENT_AMBULATORY_CARE_PROVIDER_SITE_OTHER): Payer: BC Managed Care – PPO | Admitting: Orthopaedic Surgery

## 2021-05-25 ENCOUNTER — Encounter: Payer: Self-pay | Admitting: Orthopaedic Surgery

## 2021-05-25 VITALS — BP 142/84 | HR 82 | Ht 72.0 in | Wt 165.0 lb

## 2021-05-25 DIAGNOSIS — Z981 Arthrodesis status: Secondary | ICD-10-CM

## 2021-05-25 NOTE — Progress Notes (Signed)
   Post-Op Visit Note   Patient: Luke Mcgrath           Date of Birth: 12-Jan-1986           MRN: [id number] Visit Date: 05/25/2021 PCP: Eulas Post, MD   Assessment & Plan: Follow-up single level cervical fusion for disc herniation cord compression.  X-rays show motion on flexion-extension.  Good relief of preop numbness and tingling upper extremities.  Discontinue collar.  Neck incisions well-healed.  Work slip given for resume work on 06/03/2021 without restrictions.  He is happy with the results of surgery and return as needed.  Chief Complaint:  Chief Complaint  Patient presents with   Neck - Follow-up    04/13/2021 C5-6 ACDF   Visit Diagnoses:  1. History of fusion of cervical spine     Plan: Return to work note for 06/03/2021.  Follow-up here as needed.  Follow-Up Instructions: No follow-ups on file.   Orders:  Orders Placed This Encounter  Procedures   XR Cervical Spine 2 or 3 views   No orders of the defined types were placed in this encounter.   Imaging: XR Cervical Spine 2 or 3 views  Result Date: 05/25/2021 AP lateral cervical spine x-rays are obtained and reviewed polyp.  This shows satisfactory C5-6 without motion on flexion-extension lateral radiographs. Impression: C5-6 cervical fusion without motion on flexion-extension images.  Previous posterior laminar plating again noted.   PMFS History: Patient Active Problem List   Diagnosis Date Noted   History of fusion of cervical spine 04/13/2021   Protrusion of cervical intervertebral disc 03/25/2021   Carpal tunnel syndrome of left wrist 01/22/2018   Carpal tunnel syndrome of right wrist 01/22/2018   Prurigo nodularis 06/18/2017   GERD (gastroesophageal reflux disease) 10/06/2013   Arteriovenous fistula of spinal cord vessels 08/01/2013   Neuralgia of left thigh 07/01/2013   Chronic anxiety 07/01/2013   Past Medical History:  Diagnosis Date   Anxiety    Cavernous malformation 09/11/2013    Cervical region   GERD (gastroesophageal reflux disease)     Family History  Problem Relation Age of Onset   Healthy Mother    Hyperlipidemia Father    Hypertension Father    Diabetes Father    Cancer Maternal Grandmother        colon   Hyperlipidemia Maternal Grandmother    Other Brother        Died, Nature conservation officer services in [place]   Healthy Sister     Past Surgical History:  Procedure Laterality Date   ANTERIOR CERVICAL DECOMP/DISCECTOMY FUSION N/A 04/13/2021   Procedure: C5-6 ANTERIOR CERVICAL DISCECTOMY FUSION, ALLOGRAFT, PLATE;  Surgeon: [name], MD;  Location: [facility];  Service: Orthopedics;  Laterality: N/A;   BRAIN SURGERY  09/11/1988   age 41- reconstruction   CARPAL TUNNEL RELEASE Left 09/2018   CERVICAL LAMINECTOMY FOR EXCISION OF NEOPLASM  09/11/2013   Cervical cavernoma at C6-7   RECONSTRUCTION OF NOSE  09/11/1992   Social History   Occupational History   Not on file  Tobacco Use   Smoking status: Never   Smokeless tobacco: Never  Vaping Use   Vaping Use: Never used  Substance and Sexual Activity   Alcohol use: Yes    Comment: 7 drinks a week   Drug use: Not Currently    Types: Marijuana   Sexual activity: Not on file

## 2021-08-12 ENCOUNTER — Ambulatory Visit (INDEPENDENT_AMBULATORY_CARE_PROVIDER_SITE_OTHER): Payer: BC Managed Care – PPO | Admitting: Family Medicine

## 2021-08-12 ENCOUNTER — Encounter: Payer: Self-pay | Admitting: Family Medicine

## 2021-08-12 VITALS — BP 122/78 | HR 77 | Temp 97.8°F | Ht 72.0 in | Wt 168.7 lb

## 2021-08-12 DIAGNOSIS — R0789 Other chest pain: Secondary | ICD-10-CM | POA: Diagnosis not present

## 2021-08-12 DIAGNOSIS — Z113 Encounter for screening for infections with a predominantly sexual mode of transmission: Secondary | ICD-10-CM | POA: Diagnosis not present

## 2021-08-12 DIAGNOSIS — I519 Heart disease, unspecified: Secondary | ICD-10-CM

## 2021-08-12 NOTE — Progress Notes (Signed)
Established Patient Office Visit  Subjective:  Patient ID: Luke Mcgrath, male    DOB: 01/20/1986  Age: 35 y.o. MRN: [id number]  CC:  Chief Complaint  Patient presents with   Heart Problem    HPI Luke Mcgrath presents for recent atypical chest pain symptoms.  He states about 2 months ago he had sharp fleeting chest pain lasting seconds.  Occasional symptoms in the left upper extremity.  No exertional pain.  He has had some increased stress issues with work.  He also has very physical work.  Engineer, maintenance (IT).  Has never had any chest pain with physical exertion.  No family history of CAD.  Denies any pleuritic pain.  No cough.  Does have history of chronic cervical neck problems but no recent radiculitis symptoms.  Non-smoker.  Patient requesting STD screening.  Recent new partner.  He does have reported history of herpes.  Is using barrier protection now.  No recent rash, dysuria, or any other acute symptoms.  He simply wants the test to make sure he is clear.  He has had hepatitis B vaccine previously.  Past Medical History:  Diagnosis Date   Anxiety    Cavernous malformation 09/11/2013   Cervical region   GERD (gastroesophageal reflux disease)     Past Surgical History:  Procedure Laterality Date   ANTERIOR CERVICAL DECOMP/DISCECTOMY FUSION N/A 04/13/2021   Procedure: C5-6 ANTERIOR CERVICAL DISCECTOMY FUSION, ALLOGRAFT, PLATE;  Surgeon: [name], MD;  Location: [facility];  Service: Orthopedics;  Laterality: N/A;   BRAIN SURGERY  09/11/1988   age 55- reconstruction   CARPAL TUNNEL RELEASE Left 09/2018   CERVICAL LAMINECTOMY FOR EXCISION OF NEOPLASM  09/11/2013   Cervical cavernoma at C6-7   RECONSTRUCTION OF NOSE  09/11/1992    Family History  Problem Relation Age of Onset   Healthy Mother    Hyperlipidemia Father    Hypertension Father    Diabetes Father    Cancer Maternal Grandmother        colon   Hyperlipidemia Maternal Grandmother    Other Brother         Died, Nature conservation officer services in [place]   Healthy Sister     Social History   Socioeconomic History   Marital status: Legally Separated    Spouse name: Not on file   Number of children: Not on file   Years of education: Not on file   Highest education level: Not on file  Occupational History   Not on file  Tobacco Use   Smoking status: Never   Smokeless tobacco: Never  Vaping Use   Vaping Use: Never used  Substance and Sexual Activity   Alcohol use: Yes    Comment: 7 drinks a week   Drug use: Not Currently    Types: Marijuana   Sexual activity: Not on file  Other Topics Concern   Not on file  Social History Narrative   Lives with wife and two children.   He works for an [employer].       Social Determinants of Health   Financial Resource Strain: Not on file  Food Insecurity: Not on file  Transportation Needs: Not on file  Physical Activity: Not on file  Stress: Not on file  Social Connections: Not on file  Intimate Partner Violence: Not on file    Outpatient Medications Prior to Visit  Medication Sig Dispense Refill   Ascorbic Acid (VITAMIN C PO) Take 1 tablet by mouth daily.  Calcium-Magnesium-Zinc (CAL-MAG-ZINC PO) Take 3 tablets by mouth every 14 (fourteen) days.     gabapentin (NEURONTIN) 300 MG capsule Take 300 mg by mouth 3 (three) times daily.     ibuprofen (ADVIL) 200 MG tablet Take 600-800 mg by mouth every 8 (eight) hours as needed for headache or moderate pain.     oxyCODONE-acetaminophen (PERCOCET) 10-325 MG tablet Take 1 tablet by mouth every 6 (six) hours as needed for pain. 20 tablet 0   oxyCODONE-acetaminophen (PERCOCET/ROXICET) 5-325 MG tablet Take 1 tablet by mouth 4 (four) times daily as needed for moderate pain.     VITAMIN D PO Take 1 tablet by mouth daily.     No facility-administered medications prior to visit.    No Known Allergies  ROS Review of Systems  Constitutional:  Negative for appetite change, chills, fever and unexpected  weight change.  Respiratory:  Negative for cough and shortness of breath.   Cardiovascular:  Positive for chest pain.       See HPI  Genitourinary:  Negative for dysuria and genital sores.     Objective:    Physical Exam Vitals reviewed.  Constitutional:      Appearance: Normal appearance.  Cardiovascular:     Rate and Rhythm: Normal rate and regular rhythm.     Heart sounds: No murmur heard.   No friction rub. No gallop.  Pulmonary:     Effort: Pulmonary effort is normal.     Breath sounds: Normal breath sounds. No wheezing or rales.  Musculoskeletal:     Right lower leg: No edema.     Left lower leg: No edema.  Skin:    Findings: No rash.  Neurological:     Mental Status: He is alert.    BP 122/78 (BP Location: Left Arm, Cuff Size: Normal)   Pulse 77   Temp 97.8 F (36.6 C) (Oral)   Ht 6' (1.829 m)   Wt 168 lb 11.2 oz (76.5 kg)   SpO2 96%   BMI 22.88 kg/m  Wt Readings from Last 3 Encounters:  08/12/21 168 lb 11.2 oz (76.5 kg)  05/25/21 165 lb (74.8 kg)  04/13/21 165 lb (74.8 kg)     Health Maintenance Due  Topic Date Due   COVID-19 Vaccine (1) Never done   Hepatitis C Screening  Never done   INFLUENZA VACCINE  04/11/2021    There are no preventive care reminders to display for this patient.  Lab Results  Component Value Date   TSH 0.92 09/21/2016   Lab Results  Component Value Date   WBC 11.9 (H) 04/14/2021   HGB 15.8 04/14/2021   HCT 45.0 04/14/2021   MCV 89.1 04/14/2021   PLT 256 04/14/2021   Lab Results  Component Value Date   NA 136 04/14/2021   K 4.6 04/14/2021   CO2 25 04/14/2021   GLUCOSE 132 (H) 04/14/2021   BUN 16 04/14/2021   CREATININE 0.89 04/14/2021   BILITOT 1.0 04/13/2021   ALKPHOS 57 04/13/2021   AST 18 04/13/2021   ALT 12 04/13/2021   PROT 7.1 04/13/2021   ALBUMIN 4.5 04/13/2021   CALCIUM 10.3 04/14/2021   ANIONGAP 14 04/14/2021   GFR 95.04 09/21/2016   Lab Results  Component Value Date   CHOL 203 (H) 09/21/2016    Lab Results  Component Value Date   HDL 54.20 09/21/2016   Lab Results  Component Value Date   LDLCALC 124 (H) 09/21/2016   Lab Results  Component Value Date  TRIG 122.0 09/21/2016   Lab Results  Component Value Date   CHOLHDL 4 09/21/2016   No results found for: HGBA1C    Assessment & Plan:   Problem List Items Addressed This Visit   None Visit Diagnoses     Atypical chest pain    -  Primary   Relevant Orders   EKG 12-Lead (Completed)   Screen for STD (sexually transmitted disease)       Relevant Orders   HIV antibody (with reflex)   RPR   Urine cytology ancillary only     Patient presents with atypical chest pain.  Never exertional.  Atypical features including at rest, sharp quality, and fleeting lasting only seconds.  Doubt cardiac.  EKG shows normal sinus rhythm with no acute ST-T changes.  -Reassurance.  We reviewed signs and symptoms to look out for for more concerning chest symptoms such as exertional chest pain, diaphoresis, etc.  -Patient requesting STD screening.  Will check HIV, RPR, urine for GC, chlamydia, trichomonas -Discussed STD prevention  No orders of the defined types were placed in this encounter.   Follow-up: No follow-ups on file.    Carolann Littler, MD

## 2021-08-15 ENCOUNTER — Telehealth: Payer: Self-pay | Admitting: Family Medicine

## 2021-08-15 LAB — RPR: RPR Ser Ql: NONREACTIVE

## 2021-08-15 LAB — HIV ANTIBODY (ROUTINE TESTING W REFLEX): HIV 1&2 Ab, 4th Generation: NONREACTIVE

## 2021-08-15 NOTE — Telephone Encounter (Signed)
Pt called back and has been sch for 08-19-2021

## 2021-08-15 NOTE — Telephone Encounter (Signed)
Pt needs to have urine test redone. The specimen went to [facility] without beening label . Pt vm is full

## 2021-08-19 ENCOUNTER — Other Ambulatory Visit: Payer: Self-pay

## 2021-08-19 ENCOUNTER — Other Ambulatory Visit (INDEPENDENT_AMBULATORY_CARE_PROVIDER_SITE_OTHER): Payer: BC Managed Care – PPO

## 2021-08-19 DIAGNOSIS — Z113 Encounter for screening for infections with a predominantly sexual mode of transmission: Secondary | ICD-10-CM

## 2021-08-19 LAB — URINALYSIS, ROUTINE W REFLEX MICROSCOPIC
Bilirubin Urine: NEGATIVE
Hgb urine dipstick: NEGATIVE
Ketones, ur: NEGATIVE
Leukocytes,Ua: NEGATIVE
Nitrite: NEGATIVE
Specific Gravity, Urine: 1.01 (ref 1.000–1.030)
Total Protein, Urine: NEGATIVE
Urine Glucose: NEGATIVE
Urobilinogen, UA: 0.2 (ref 0.0–1.0)
pH: 6 (ref 5.0–8.0)

## 2021-08-22 NOTE — Progress Notes (Signed)
This was placed as a future order for urine cytology for GC and chlamydia

## 2021-08-26 ENCOUNTER — Other Ambulatory Visit: Payer: Self-pay | Admitting: Family Medicine

## 2021-08-26 ENCOUNTER — Other Ambulatory Visit (HOSPITAL_COMMUNITY)
Admission: RE | Admit: 2021-08-26 | Discharge: 2021-08-26 | Disposition: A | Discharge status: Home / Self Care | Payer: BC Managed Care – PPO | Source: Ambulatory Visit | Attending: Family Medicine | Admitting: Family Medicine

## 2021-08-26 ENCOUNTER — Telehealth: Payer: Self-pay | Admitting: Family Medicine

## 2021-08-26 ENCOUNTER — Other Ambulatory Visit: Payer: BC Managed Care – PPO

## 2021-08-26 DIAGNOSIS — Z113 Encounter for screening for infections with a predominantly sexual mode of transmission: Secondary | ICD-10-CM | POA: Insufficient documentation

## 2021-08-26 MED ORDER — DOXYCYCLINE HYCLATE 100 MG PO CAPS: 100.0000 mg | ORAL_CAPSULE | Freq: Two times a day (BID) | ORAL | 0 refills | Status: DC

## 2021-08-26 NOTE — Telephone Encounter (Signed)
Patient recently had requested STD screening.  His RPR and HIV came back negative.  Unfortunately, his urine studies were sent off for the wrong test twice by lab.  He came back for third time today to have appropriate specimen sent for chlamydia and GC.  Patient relates in the meantime he has developed some mild occasional burning with urination.  No fever.  No penile discharge.  We elected to go and cover with doxycycline 100 mg twice daily for 7 days pending results

## 2021-08-26 NOTE — Telephone Encounter (Signed)
Patient stated that you called him directly and he was just returning you call. He was informed that you are in with patients at this time.

## 2021-08-26 NOTE — Telephone Encounter (Signed)
Pt is returning md call

## 2021-08-28 NOTE — Telephone Encounter (Signed)
I spoke with patient on Friday.   We decided to go ahead and send in Doxycycline pending his urine cytology.   No fever or purulent penile d/c to suggest likely GC.

## 2021-08-29 ENCOUNTER — Telehealth: Payer: BC Managed Care – PPO | Admitting: Family Medicine

## 2021-08-31 ENCOUNTER — Telehealth: Payer: Self-pay | Admitting: Family Medicine

## 2021-08-31 LAB — URINE CYTOLOGY ANCILLARY ONLY
Chlamydia: NEGATIVE
Comment: NEGATIVE
Comment: NORMAL
Neisseria Gonorrhea: NEGATIVE

## 2021-08-31 NOTE — Telephone Encounter (Signed)
Pt is calling and would like urine test result

## 2021-09-01 NOTE — Telephone Encounter (Signed)
Awaiting result reply.   Please advise

## 2021-09-01 NOTE — Telephone Encounter (Signed)
Spoke with patient about results.

## 2021-10-26 ENCOUNTER — Encounter (HOSPITAL_BASED_OUTPATIENT_CLINIC_OR_DEPARTMENT_OTHER): Payer: Self-pay

## 2021-10-26 ENCOUNTER — Ambulatory Visit (HOSPITAL_BASED_OUTPATIENT_CLINIC_OR_DEPARTMENT_OTHER): Admit: 2021-10-26 | Payer: BC Managed Care – PPO | Admitting: Orthopaedic Surgery

## 2021-10-26 SURGERY — ARTHROPLASTY, SHOULDER, TOTAL, REVERSE
Anesthesia: Choice | Site: Shoulder | Laterality: Right

## 2021-11-26 ENCOUNTER — Ambulatory Visit (HOSPITAL_COMMUNITY)
Admission: EM | Admit: 2021-11-26 | Discharge: 2021-11-26 | Disposition: A | Discharge status: Home / Self Care | Payer: BC Managed Care – PPO | Attending: Emergency Medicine | Admitting: Emergency Medicine

## 2021-11-26 DIAGNOSIS — U071 COVID-19: Secondary | ICD-10-CM | POA: Insufficient documentation

## 2021-11-26 LAB — POCT RAPID STREP A, ED / UC: Streptococcus, Group A Screen (Direct): NEGATIVE

## 2021-11-26 MED ORDER — LIDOCAINE VISCOUS HCL 2 % MT SOLN: 15.0000 mL | OROMUCOSAL | 0 refills | Status: DC | PRN

## 2021-11-26 MED ORDER — MOLNUPIRAVIR EUA 200MG CAPSULE: 4.0000 | ORAL_CAPSULE | Freq: Two times a day (BID) | ORAL | 0 refills | Status: AC

## 2021-11-26 NOTE — ED Triage Notes (Signed)
Pt c/o cough, congestion, body aches, sore throat, chills since earlier this week. Reports home covid test was positive.  ?

## 2021-11-26 NOTE — Discharge Instructions (Signed)
COVID-19 virus and will steadily improve with time ? ?You may take antiviral twice a day for the next 7 days to ideally reduce the severity and length of your illness ? ?You may gargle and spit lidocaine solution every 4 hours to give your temporary numbing effect to your sore throat ? ?You may ?You can take Tylenol and/or Ibuprofen as needed for fever reduction and pain relief. ?  ?For cough: honey 1/2 to 1 teaspoon (you can dilute the honey in water or another fluid).  You can also use guaifenesin and dextromethorphan for cough. You can use a humidifier for chest congestion and cough.  If you don't have a humidifier, you can sit in the bathroom with the hot shower running.    ?  ?For sore throat: try warm salt water gargles, cepacol lozenges, throat spray, warm tea or water with lemon/honey, popsicles or ice, or OTC cold relief medicine for throat discomfort. ?  ?For congestion: take a daily anti-histamine like Zyrtec, Claritin, and a oral decongestant, such as pseudoephedrine.  You can also use Flonase 1-2 sprays in each nostril daily. ?  ?It is important to stay hydrated: drink plenty of fluids (water, gatorade/powerade/pedialyte, juices, or teas) to keep your throat moisturized and help further relieve irritation/discomfort.  ?

## 2021-11-26 NOTE — ED Provider Notes (Signed)
?[facility] ? ? ? ?CSN: [id number] ?Arrival date & time: 11/26/21  1525 ? ? ?  ? ?History   ?Chief Complaint ?Chief Complaint  ?Patient presents with  ? Covid Positive  ? Cough  ? Sore Throat  ? ? ?HPI ?Luke Mcgrath is a 36 y.o. male.  ? ?Patient presents with fever, chills, body aches, nasal congestion, sore throat, nonproductive cough and generalized headaches for 3 days.  Painful to swallow but able to tolerate food and liquids.  No known sick contacts.  Home COVID test +1-day ago.  Has attempted use of DayQuil, NyQuil and ibuprofen which is somewhat helpful.  ? ? ?Past Medical History:  ?Diagnosis Date  ? Anxiety   ? Cavernous malformation 09/11/2013  ? Cervical region  ? GERD (gastroesophageal reflux disease)   ? ? ?Patient Active Problem List  ? Diagnosis Date Noted  ? History of fusion of cervical spine 04/13/2021  ? Protrusion of cervical intervertebral disc 03/25/2021  ? Carpal tunnel syndrome of left wrist 01/22/2018  ? Carpal tunnel syndrome of right wrist 01/22/2018  ? Prurigo nodularis 06/18/2017  ? GERD (gastroesophageal reflux disease) 10/06/2013  ? 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 Neuralgia of left thigh 07/01/2013  ? Chronic anxiety 07/01/2013  ? ? ?Past Surgical History:  ?Procedure Laterality Date  ? ANTERIOR CERVICAL DECOMP/DISCECTOMY FUSION N/A 04/13/2021  ? Procedure: C5-6 ANTERIOR CERVICAL DISCECTOMY FUSION, ALLOGRAFT, PLATE;  Surgeon: [name], MD;  Location: [facility];  Service: Orthopedics;  Laterality: N/A;  ? BRAIN SURGERY  09/11/1988  ? age 50- reconstruction  ? CARPAL TUNNEL RELEASE Left 09/2018  ? CERVICAL LAMINECTOMY FOR EXCISION OF NEOPLASM  09/11/2013  ? Cervical cavernoma at C6-7  ? RECONSTRUCTION OF NOSE  09/11/1992  ? ? ? ? ? ?Home Medications   ? ?Prior to Admission medications   ?Medication Sig Start Date End Date Taking? Authorizing Provider  ?Ascorbic Acid (VITAMIN C PO) Take 1 tablet by mouth daily.    [provider]   ?Calcium-Magnesium-Zinc (CAL-MAG-ZINC PO) Take 3 tablets by mouth every 14 (fourteen) days.    [provider]  ?doxycycline (VIBRAMYCIN) 100 MG capsule Take 1 capsule (100 mg total) by mouth 2 (two) times daily. 08/26/21   Burchette, Alinda Sierras, MD  ?gabapentin (NEURONTIN) 300 MG capsule Take 300 mg by mouth 3 (three) times daily.    [provider]  ?ibuprofen (ADVIL) 200 MG tablet Take 600-800 mg by mouth every 8 (eight) hours as needed for headache or moderate pain.    [provider]  ?oxyCODONE-acetaminophen (PERCOCET) 10-325 MG tablet Take 1 tablet by mouth every 6 (six) hours as needed for pain. 04/14/21 04/14/22  [name], MD  ?oxyCODONE-acetaminophen (PERCOCET/ROXICET) 5-325 MG tablet Take 1 tablet by mouth 4 (four) times daily as needed for moderate pain. 08/06/19   [provider]  ?VITAMIN D PO Take 1 tablet by mouth daily.    [provider]  ? ? ?Family History ?Family History  ?Problem Relation Age of Onset  ? Healthy Mother   ? Hyperlipidemia Father   ? Hypertension Father   ? Diabetes Father   ? Cancer Maternal Grandmother   ?     colon  ? Hyperlipidemia Maternal Grandmother   ? Other Brother   ?     Died, [facility] in [place]  ? Healthy Sister   ? ? ?Social History ?Social History  ? ?Tobacco Use  ? Smoking status: Never  ?  Smokeless tobacco: Never  ?Vaping Use  ? Vaping Use: Never used  ?Substance Use Topics  ? Alcohol use: Yes  ?  Comment: 7 drinks a week  ? Drug use: Not Currently  ?  Types: Marijuana  ? ? ? ?Allergies   ?Patient has no known allergies. ? ? ?Review of Systems ?Review of Systems  ?Constitutional:  Positive for chills and fever. Negative for activity change, appetite change, diaphoresis, fatigue and unexpected weight change.  ?HENT:  Positive for congestion, sore throat and voice change. Negative for dental problem, drooling, ear discharge, ear pain, facial swelling, hearing loss, mouth sores, nosebleeds, postnasal drip,  rhinorrhea, sinus pressure, sinus pain, sneezing, tinnitus and trouble swallowing.   ?Respiratory:  Positive for cough. Negative for apnea, choking, chest tightness, shortness of breath, wheezing and stridor.   ?Musculoskeletal: Negative.   ?Skin: Negative.   ?Neurological:  Positive for headaches. Negative for dizziness, tremors, seizures, syncope, facial asymmetry, speech difficulty, weakness, light-headedness and numbness.  ? ? ?Physical Exam ?Triage Vital Signs ?ED Triage Vitals  ?Enc Vitals Group  ?   BP 11/26/21 1607 126/79  ?   Pulse Rate 11/26/21 1607 72  ?   Resp 11/26/21 1607 16  ?   Temp 11/26/21 1607 98.3 ?F (36.8 ?C)  ?   Temp Source 11/26/21 1607 Oral  ?   SpO2 11/26/21 1607 96 %  ?   Weight --   ?   Height --   ?   Head Circumference --   ?   Peak Flow --   ?   Pain Score 11/26/21 1605 7  ?   Pain Loc --   ?   Pain Edu? --   ?   Excl. in [place]? --   ? ?No data found. ? ?Updated Vital Signs ?BP 126/79 (BP Location: Left Arm)   Pulse 72   Temp 98.3 ?F (36.8 ?C) (Oral)   Resp 16   SpO2 96%  ? ?Visual Acuity ?Right Eye Distance:   ?Left Eye Distance:   ?Bilateral Distance:   ? ?Right Eye Near:   ?Left Eye Near:    ?Bilateral Near:    ? ?Physical Exam ?Constitutional:   ?   Appearance: Normal appearance. He is well-developed.  ?HENT:  ?   Head: Normocephalic.  ?   Right Ear: Tympanic membrane and ear canal normal.  ?   Left Ear: Tympanic membrane and ear canal normal.  ?   Nose: No congestion or rhinorrhea.  ?   Mouth/Throat:  ?   Mouth: Mucous membranes are moist.  ?   Pharynx: Posterior oropharyngeal erythema present.  ?   Tonsils: No tonsillar exudate. 1+ on the right. 1+ on the left.  ?Eyes:  ?   Extraocular Movements: Extraocular movements intact.  ?Cardiovascular:  ?   Rate and Rhythm: Normal rate and regular rhythm.  ?   Pulses: Normal pulses.  ?   Heart sounds: Normal heart sounds.  ?Pulmonary:  ?   Effort: Pulmonary effort is normal.  ?   Breath sounds: Normal breath sounds.  ?Musculoskeletal:  ?    Cervical back: Normal range of motion and neck supple.  ?Skin: ?   General: Skin is warm and dry.  ?Neurological:  ?   General: No focal deficit present.  ?   Mental Status: He is alert and oriented to person, place, and time.  ?Psychiatric:     ?   Mood and Affect: Mood normal.     ?  Behavior: Behavior normal.  ? ? ? ?UC Treatments / Results  ?Labs ?(all labs ordered are listed, but only abnormal results are displayed) ?Labs Reviewed - No data to display ? ?EKG ? ? ?Radiology ?No results found. ? ?Procedures ?Procedures (including critical care time) ? ?Medications Ordered in UC ?Medications - No data to display ? ?Initial Impression / Assessment and Plan / UC Course  ?I have reviewed the triage vital signs and the nursing notes. ? ?Pertinent labs & imaging results that were available during my care of the patient were reviewed by me and considered in my medical decision making (see chart for details). ? ?COVID-19 ? ? ?Vitals Stable, O2 saturation 96% on room air and lungs are clear to auscultation, patient in no signs of distress, stable for outpatient treatment.,  Rapid strep test negative, sent for culture, discussed all findings with patient, antiviral treatment sent to pharmacy as well as lidocaine viscous for sore throat is most worrisome symptom today, may continue use of over-the-counter medications for additional supportive care with urgent care follow-up as needed, work note given ?Final Clinical Impressions(s) / UC Diagnoses  ? ?Final diagnoses:  ?None  ? ?Discharge Instructions   ?None ?  ? ?ED Prescriptions   ?None ?  ? ?PDMP not reviewed this encounter. ?  ?Hans Eden, NP ?11/26/21 1651 ? ?

## 2021-11-29 LAB — CULTURE, GROUP A STREP (THRC)

## 2021-12-30 ENCOUNTER — Ambulatory Visit (HOSPITAL_COMMUNITY)
Admission: EM | Admit: 2021-12-30 | Discharge: 2021-12-30 | Disposition: A | Discharge status: Home / Self Care | Payer: BC Managed Care – PPO | Attending: Nurse Practitioner | Admitting: Nurse Practitioner

## 2021-12-30 ENCOUNTER — Encounter (HOSPITAL_COMMUNITY): Payer: Self-pay | Admitting: Emergency Medicine

## 2021-12-30 ENCOUNTER — Ambulatory Visit (INDEPENDENT_AMBULATORY_CARE_PROVIDER_SITE_OTHER): Payer: BC Managed Care – PPO

## 2021-12-30 DIAGNOSIS — M25572 Pain in left ankle and joints of left foot: Secondary | ICD-10-CM | POA: Diagnosis not present

## 2021-12-30 NOTE — ED Provider Notes (Signed)
?[name] ? ? ? ?CSN: [id number] ?Arrival date & time: 12/30/21  1621 ? ? ?  ? ?History   ?Chief Complaint ?Chief Complaint  ?Patient presents with  ? Ankle Pain  ? ? ?HPI ?Luke Mcgrath is a 36 y.o. male.  ? ?Patient presents with left ankle pain for the past roughly 30 days.  He reports initially when the ankle started hurting, it was swollen and slightly red to the outside of the ankle.  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  He thought the ankle pain would initially get better, however it has not.  He has been wearing an ASO brace for the past week and taking ibuprofen which does not help with the pain.  He denies any fall, accident, significant injury that he knows of before the pain in the left ankle started.  He reports the pain is moderate, worse with weightbearing.  He wears work boots while at work.  He denies numbness or tingling in the toes on the left foot, decreased strength or sensation, fevers, and nausea/vomiting.  ? ? ?Past Medical History:  ?Diagnosis Date  ? Anxiety   ? Cavernous malformation 09/11/2013  ? Cervical region  ? GERD (gastroesophageal reflux disease)   ? ? ?Patient Active Problem List  ? Diagnosis Date Noted  ? History of fusion of cervical spine 04/13/2021  ? Protrusion of cervical intervertebral disc 03/25/2021  ? Carpal tunnel syndrome of left wrist 01/22/2018  ? Carpal tunnel syndrome of right wrist 01/22/2018  ? Prurigo nodularis 06/18/2017  ? GERD (gastroesophageal reflux disease) 10/06/2013  ? 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 Neuralgia of left thigh 07/01/2013  ? Chronic anxiety 07/01/2013  ? ? ?Past Surgical History:  ?Procedure Laterality Date  ? ANTERIOR CERVICAL DECOMP/DISCECTOMY FUSION N/A 04/13/2021  ? Procedure: C5-6 ANTERIOR CERVICAL DISCECTOMY FUSION, ALLOGRAFT, PLATE;  Surgeon: [name], MD;  Location: [facility];  Service: Orthopedics;  Laterality: N/A;  ? BRAIN SURGERY  09/11/1988  ? age 61-  reconstruction  ? CARPAL TUNNEL RELEASE Left 09/2018  ? CERVICAL LAMINECTOMY FOR EXCISION OF NEOPLASM  09/11/2013  ? Cervical cavernoma at C6-7  ? RECONSTRUCTION OF NOSE  09/11/1992  ? ? ? ? ? ?Home Medications   ? ?Prior to Admission medications   ?Medication Sig Start Date End Date Taking? Authorizing Provider  ?gabapentin (NEURONTIN) 300 MG capsule Take 300 mg by mouth 3 (three) times daily.    [provider]  ?ibuprofen (ADVIL) 200 MG tablet Take 600-800 mg by mouth every 8 (eight) hours as needed for headache or moderate pain.    [provider]  ? ? ?Family History ?Family History  ?Problem Relation Age of Onset  ? Healthy Mother   ? Hyperlipidemia Father   ? Hypertension Father   ? Diabetes Father   ? Cancer Maternal Grandmother   ?     colon  ? Hyperlipidemia Maternal Grandmother   ? Other Brother   ?     Died, [place] [facility] in [place]  ? Healthy Sister   ? ? ?Social History ?Social History  ? ?Tobacco Use  ? Smoking status: Never  ? Smokeless tobacco: Never  ?Vaping Use  ? Vaping Use: Never used  ?Substance Use Topics  ? Alcohol use: Yes  ?  Comment: 7 drinks a week  ? Drug use: Not Currently  ?  Types: Marijuana  ? ? ? ?Allergies   ?  Patient has no known allergies. ? ? ?Review of Systems ?Review of Systems ?Per HPI ? ?Physical Exam ?Triage Vital Signs ?ED Triage Vitals  ?Enc Vitals Group  ?   BP 12/30/21 1634 128/84  ?   Pulse Rate 12/30/21 1634 79  ?   Resp 12/30/21 1634 15  ?   Temp 12/30/21 1634 98 ?F (36.7 ?C)  ?   Temp Source 12/30/21 1634 Oral  ?   SpO2 12/30/21 1634 97 %  ?   Weight --   ?   Height --   ?   Head Circumference --   ?   Peak Flow --   ?   Pain Score 12/30/21 1633 7  ?   Pain Loc --   ?   Pain Edu? --   ?   Excl. in [place]? --   ? ?No data found. ? ?Updated Vital Signs ?BP 128/84 (BP Location: Right Arm)   Pulse 79   Temp 98 ?F (36.7 ?C) (Oral)   Resp 15   SpO2 97%  ? ?Visual Acuity ?Right Eye Distance:   ?Left Eye Distance:   ?Bilateral Distance:   ? ?Right Eye  Near:   ?Left Eye Near:    ?Bilateral Near:    ? ?Physical Exam ?Vitals and nursing note reviewed.  ?Constitutional:   ?   General: He is not in acute distress. ?   Appearance: Normal appearance. He is not toxic-appearing.  ?Pulmonary:  ?   Effort: Pulmonary effort is normal. No respiratory distress.  ?Musculoskeletal:  ?   Right ankle: Normal.  ?   Left ankle: No swelling, deformity or ecchymosis. Tenderness present over the lateral malleolus. No medial malleolus or base of 5th metatarsal tenderness. Normal range of motion. Normal pulse.  ?   Right foot: Normal. Normal range of motion and normal capillary refill. No swelling, tenderness or bony tenderness. Normal pulse.  ?   Left foot: Normal. Normal range of motion and normal capillary refill. No swelling, tenderness or bony tenderness. Normal pulse.  ?Skin: ?   General: Skin is warm and dry.  ?   Capillary Refill: Capillary refill takes less than 2 seconds.  ?   Coloration: Skin is not jaundiced or pale.  ?   Findings: No erythema.  ?Neurological:  ?   Mental Status: He is alert and oriented to person, place, and time.  ?   Motor: No weakness.  ?   Gait: Gait normal.  ?Psychiatric:     ?   Behavior: Behavior is cooperative.  ? ? ? ?UC Treatments / Results  ?Labs ?(all labs ordered are listed, but only abnormal results are displayed) ?Labs Reviewed - No data to display ? ?EKG ? ? ?Radiology ?DG Ankle Complete Left ? ?Result Date: 12/30/2021 ?CLINICAL DATA:  Left ankle pain for 1 month EXAM: LEFT ANKLE COMPLETE - 3+ VIEW COMPARISON:  None. FINDINGS: There is no evidence of fracture, dislocation, or joint effusion. There is no evidence of arthropathy or other focal bone abnormality. Soft tissues are unremarkable. IMPRESSION: Negative. Electronically Signed   By: Merilyn Baba M.D.   On: 12/30/2021 17:17   ? ?Procedures ?Procedures (including critical care time) ? ?Medications Ordered in UC ?Medications - No data to display ? ?Initial Impression / Assessment and Plan  / UC Course  ?I have reviewed the triage vital signs and the nursing notes. ? ?Pertinent labs & imaging results that were available during my care of the patient were reviewed by  me and considered in my medical decision making (see chart for details). ? ?  ?X-ray today does not show any acute bony abnormality.  He is neurovascularly intact today.  I suspect a strain of a ligament around the lateral malleolus; follow up with Foot & Ankle or Orthopedic provider early next week; contact information given.  Will give note for work in the meantime. ? ?Final Clinical Impressions(s) / UC Diagnoses  ? ?Final diagnoses:  ?Acute left ankle pain  ? ? ? ?Discharge Instructions   ? ?  ?- The ankle x-ray does not show any acute fracture of your bones today ?- I suspect there may be a strained ligament in your ankle; please follow up either with an orthopedic or foot&ankle provider.  The contact information is listed below ?- You can continue ice, ASO brace, and ibuprofen for now ? ? ? ? ? ?ED Prescriptions   ?None ?  ? ?I have reviewed the PDMP during this encounter. ?  ?Eulogio Bear, NP ?12/30/21 1733 ? ?

## 2021-12-30 NOTE — ED Triage Notes (Signed)
Pt reports that he having pain and swelling with left ankle that has progressed over the past several days. Denies any injury to cause initial pain in ankle. Wearing brace to try to help with discomfort.  ?

## 2021-12-30 NOTE — Discharge Instructions (Addendum)
-   The ankle x-ray does not show any acute fracture of your bones today ?- I suspect there may be a strained ligament in your ankle; please follow up either with an orthopedic or foot&ankle provider.  The contact information is listed below ?- You can continue ice, ASO brace, and ibuprofen for now ? ?

## 2021-12-30 NOTE — ED Provider Notes (Signed)
?[facility] ? ? ? ?CSN: [id number] ?Arrival date & time: 12/30/21  1621 ? ? ?  ? ?History   ?Chief Complaint ?Chief Complaint  ?Patient presents with  ? Ankle Pain  ? ? ?HPI ?Luke Mcgrath is a 36 y.o. male.  ? ?HPI ?Patient presents for having left ankle pain which has worsened over the last last few days. He has been wearing an ankle brace for comfort. No known injury. No prior  ? ?Past Medical History:  ?Diagnosis Date  ? Anxiety   ? Cavernous malformation 09/11/2013  ? Cervical region  ? GERD (gastroesophageal reflux disease)   ? ? ?Patient Active Problem List  ? Diagnosis Date Noted  ? History of fusion of cervical spine 04/13/2021  ? Protrusion of cervical intervertebral disc 03/25/2021  ? Carpal tunnel syndrome of left wrist 01/22/2018  ? Carpal tunnel syndrome of right wrist 01/22/2018  ? Prurigo nodularis 06/18/2017  ? GERD (gastroesophageal reflux disease) 10/06/2013  ? 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 Neuralgia of left thigh 07/01/2013  ? Chronic anxiety 07/01/2013  ? ? ?Past Surgical History:  ?Procedure Laterality Date  ? ANTERIOR CERVICAL DECOMP/DISCECTOMY FUSION N/A 04/13/2021  ? Procedure: C5-6 ANTERIOR CERVICAL DISCECTOMY FUSION, ALLOGRAFT, PLATE;  Surgeon: [name], MD;  Location: [facility];  Service: Orthopedics;  Laterality: N/A;  ? BRAIN SURGERY  09/11/1988  ? age 28- reconstruction  ? CARPAL TUNNEL RELEASE Left 09/2018  ? CERVICAL LAMINECTOMY FOR EXCISION OF NEOPLASM  09/11/2013  ? Cervical cavernoma at C6-7  ? RECONSTRUCTION OF NOSE  09/11/1992  ? ? ? ? ? ?Home Medications   ? ?Prior to Admission medications   ?Medication Sig Start Date End Date Taking? Authorizing Provider  ?gabapentin (NEURONTIN) 300 MG capsule Take 300 mg by mouth 3 (three) times daily.    [provider]  ?ibuprofen (ADVIL) 200 MG tablet Take 600-800 mg by mouth every 8 (eight) hours as needed for headache or moderate pain.    [provider]  ? ? ?Family  History ?Family History  ?Problem Relation Age of Onset  ? Healthy Mother   ? Hyperlipidemia Father   ? Hypertension Father   ? Diabetes Father   ? Cancer Maternal Grandmother   ?     colon  ? Hyperlipidemia Maternal Grandmother   ? Other Brother   ?     Died, [place] [facility] in [place]  ? Healthy Sister   ? ? ?Social History ?Social History  ? ?Tobacco Use  ? Smoking status: Never  ? Smokeless tobacco: Never  ?Vaping Use  ? Vaping Use: Never used  ?Substance Use Topics  ? Alcohol use: Yes  ?  Comment: 7 drinks a week  ? Drug use: Not Currently  ?  Types: Marijuana  ? ? ? ?Allergies   ?Patient has no known allergies. ? ? ?Review of Systems ?Review of Systems ? ? ?Physical Exam ?Triage Vital Signs ?ED Triage Vitals  ?Enc Vitals Group  ?   BP 12/30/21 1634 128/84  ?   Pulse Rate 12/30/21 1634 79  ?   Resp 12/30/21 1634 15  ?   Temp 12/30/21 1634 98 ?F (36.7 ?C)  ?   Temp Source 12/30/21 1634 Oral  ?   SpO2 12/30/21 1634 97 %  ?   Weight --   ?   Height --   ?   Head Circumference --   ?   Peak Flow --   ?  Pain Score 12/30/21 1633 7  ?   Pain Loc --   ?   Pain Edu? --   ?   Excl. in [place]? --   ? ?No data found. ? ?Updated Vital Signs ?BP 128/84 (BP Location: Right Arm)   Pulse 79   Temp 98 ?F (36.7 ?C) (Oral)   Resp 15   SpO2 97%  ? ?Visual Acuity ?Right Eye Distance:   ?Left Eye Distance:   ?Bilateral Distance:   ? ?Right Eye Near:   ?Left Eye Near:    ?Bilateral Near:    ? ?Physical Exam ?Constitutional:   ?   Appearance: Normal appearance.  ?HENT:  ?   Head: Normocephalic and atraumatic.  ?Eyes:  ?   Extraocular Movements: Extraocular movements intact.  ?   Pupils: Pupils are equal, round, and reactive to light.  ?Cardiovascular:  ?   Rate and Rhythm: Normal rate and regular rhythm.  ?Pulmonary:  ?   Effort: Pulmonary effort is normal.  ?   Breath sounds: Normal breath sounds.  ?Musculoskeletal:  ?   Left ankle: Swelling present. Tenderness present. Decreased range of motion.  ?Skin: ?   Capillary Refill:  Capillary refill takes less than 2 seconds.  ?Neurological:  ?   General: No focal deficit present.  ?   Mental Status: He is alert and oriented to person, place, and time.  ? ? ? ?UC Treatments / Results  ?Labs ?(all labs ordered are listed, but only abnormal results are displayed) ?Labs Reviewed - No data to display ? ?EKG ? ? ?Radiology ?No results found. ? ?Procedures ?Procedures (including critical care time) ? ?Medications Ordered in UC ?Medications - No data to display ? ?Initial Impression / Assessment and Plan / UC Course  ?I have reviewed the triage vital signs and the nursing notes. ? ?Pertinent labs & imaging results that were available during my care of the patient were reviewed by me and considered in my medical decision making (see chart for details). ? ?  ?Imaging negative for any acute fracture or deformity.  I suspect this may be a ligament injury which requires further evaluation and diagnostics by either a foot and ankle specialist or orthopedic provider.  Information given to follow-up with [facility].  Continue wear of ASO ankle immobilizer and ibuprofen for now.  Elevate and apply ice as needed.  Patient verbalized understanding and agreement with plan ?Final Clinical Impressions(s) / UC Diagnoses  ? ?Final diagnoses:  ?Acute left ankle pain  ? ? ? ?Discharge Instructions   ? ?  ?- The ankle x-ray does not show any acute fracture of your bones today ?- I suspect there may be a strained ligament in your ankle; please follow up either with an orthopedic or foot&ankle provider.  The contact information is listed below ?- You can continue ice, ASO brace, and ibuprofen for now ? ? ? ? ? ?ED Prescriptions   ?None ?  ? ?I have reviewed the PDMP during this encounter. ?  ?Scot Jun, FNP ?01/02/22 2024 ? ?

## 2022-01-05 ENCOUNTER — Ambulatory Visit: Payer: BC Managed Care – PPO | Admitting: Podiatrist

## 2022-03-17 ENCOUNTER — Other Ambulatory Visit: Payer: Self-pay | Admitting: Orthopaedic Surgery

## 2022-03-17 MED ORDER — PREDNISONE 50 MG PO TABS: ORAL_TABLET | ORAL | 0 refills | Status: DC

## 2022-03-24 ENCOUNTER — Ambulatory Visit: Payer: Self-pay

## 2022-03-24 ENCOUNTER — Telehealth: Payer: Self-pay | Admitting: Orthopaedic Surgery

## 2022-03-24 ENCOUNTER — Ambulatory Visit (INDEPENDENT_AMBULATORY_CARE_PROVIDER_SITE_OTHER): Payer: BC Managed Care – PPO | Admitting: Surgery

## 2022-03-24 ENCOUNTER — Encounter: Payer: Self-pay | Admitting: Surgery

## 2022-03-24 DIAGNOSIS — G8929 Other chronic pain: Secondary | ICD-10-CM

## 2022-03-24 DIAGNOSIS — M546 Pain in thoracic spine: Secondary | ICD-10-CM

## 2022-03-24 DIAGNOSIS — M5416 Radiculopathy, lumbar region: Secondary | ICD-10-CM | POA: Diagnosis not present

## 2022-03-24 DIAGNOSIS — M545 Low back pain, unspecified: Secondary | ICD-10-CM | POA: Diagnosis not present

## 2022-03-24 MED ORDER — METHYLPREDNISOLONE ACETATE 80 MG/ML IJ SUSP: 80.0000 mg | Freq: Once | INTRAMUSCULAR | Status: DC

## 2022-03-24 MED ORDER — METHOCARBAMOL 750 MG PO TABS: 750.0000 mg | ORAL_TABLET | Freq: Three times a day (TID) | ORAL | 0 refills | Status: DC | PRN

## 2022-03-24 MED ORDER — KETOROLAC TROMETHAMINE 30 MG/ML IJ SOLN: 30.0000 mg | Freq: Once | INTRAMUSCULAR | Status: AC

## 2022-03-24 NOTE — Progress Notes (Signed)
Office Visit Note   Patient: Luke Mcgrath           Date of Birth: 1986-08-13           MRN: [id number] Visit Date: 03/24/2022              Requested by: Eulas Post, MD [place],  [place] [address] PCP: Eulas Post, MD   Assessment & Plan: Visit Diagnoses:  1. Chronic low back pain, unspecified back pain laterality, unspecified whether sciatica present   2. Pain in thoracic spine   3. Radiculopathy, lumbar region     Plan: We will attend conservative treatment.  Today patient was given a Toradol 30 mg IM injection and Depo-Medrol 80 mg IM injection.  Sent prescription for Robaxin 750 mg every 8 hours as needed for spasms.  Out of work x1 week.  Follow-up with Dr. Lorin Mercy for recheck and he can decide if further imaging studies are indicated.  Follow-Up Instructions: Return in about 1 week (around 03/31/2022) for with dr yates for recheck.   Orders:  Orders Placed This Encounter  Procedures   XR Lumbar Spine 2-3 Views   XR Thoracic Spine 2 View   Meds ordered this encounter  Medications   methocarbamol (ROBAXIN) 750 MG tablet    Sig: Take 1 tablet (750 mg total) by mouth every 8 (eight) hours as needed for muscle spasms.    Dispense:  30 tablet    Refill:  0    Discontinue baclofen   ketorolac (TORADOL) 30 MG/ML injection 30 mg   methylPREDNISolone acetate (DEPO-MEDROL) injection 80 mg      Procedures: No procedures performed   Clinical Data: No additional findings.   Subjective: Chief Complaint  Patient presents with   Lower Back - Pain   Middle Back - Pain    HPI 36 year old white male comes in today with complaints of low back pain and left lower extremity radiculopathy.  States that he has had off-and-on back pain for about 3 years but problems been worse the last few weeks with pain radiating down the left leg.  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  Dr. Ninfa Linden had called in for prednisone for 5 days starting March 17, 2022.  States that he really has not had much improvement. Review of Systems No current complaints of cardiopulmonary GI/GU issues  Objective: Vital Signs: There were no vitals taken for this visit.  Physical Exam HENT:     Head: Normocephalic and atraumatic.     Nose: Nose normal.  Pulmonary:     Effort: No respiratory distress.  Musculoskeletal:     Comments: Gait is somewhat antalgic.  Positive left sided lumbar paraspinal tenderness/spasm.  Positive left-sided notch tenderness.  Positive left straight leg raise.  No focal motor deficits.  Neurological:     Mental Status: He is alert and oriented to person, place, and time.     Ortho Exam  Specialty Comments:  No specialty comments available.  Imaging: No results found.   PMFS History: Patient Active Problem List   Diagnosis Date Noted   History of fusion of cervical spine 04/13/2021   Protrusion of cervical intervertebral disc 03/25/2021   Carpal tunnel syndrome of left wrist 01/22/2018   Carpal tunnel syndrome of right wrist 01/22/2018   Prurigo nodularis 06/18/2017   GERD (gastroesophageal reflux disease) 10/06/2013   Arteriovenous fistula  of spinal cord vessels 08/01/2013   Neuralgia of left thigh 07/01/2013   Chronic anxiety 07/01/2013   Past Medical History:  Diagnosis Date   Anxiety    Cavernous malformation 09/11/2013   Cervical region   GERD (gastroesophageal reflux disease)     Family History  Problem Relation Age of Onset   Healthy Mother    Hyperlipidemia Father    Hypertension Father    Diabetes Father    Cancer Maternal Grandmother        colon   Hyperlipidemia Maternal Grandmother    Other Brother        Died, Nature conservation officer services in [place]   Healthy Sister     Past Surgical History:  Procedure Laterality Date   ANTERIOR CERVICAL DECOMP/DISCECTOMY FUSION N/A 04/13/2021   Procedure: C5-6 ANTERIOR CERVICAL  DISCECTOMY FUSION, ALLOGRAFT, PLATE;  Surgeon: [name], MD;  Location: [facility];  Service: Orthopedics;  Laterality: N/A;   BRAIN SURGERY  09/11/1988   age 9- reconstruction   CARPAL TUNNEL RELEASE Left 09/2018   CERVICAL LAMINECTOMY FOR EXCISION OF NEOPLASM  09/11/2013   Cervical cavernoma at C6-7   RECONSTRUCTION OF NOSE  09/11/1992   Social History   Occupational History   Not on file  Tobacco Use   Smoking status: Never   Smokeless tobacco: Never  Vaping Use   Vaping Use: Never used  Substance and Sexual Activity   Alcohol use: Yes    Comment: 7 drinks a week   Drug use: Not Currently    Types: Marijuana   Sexual activity: Not on file

## 2022-03-24 NOTE — Telephone Encounter (Signed)
Patients follow up states "Return in about 1 week (around 03/31/2022) for with dr Lorin Mercy for recheck." per Barbara Cower is not available until 08/08 please advise on urgency of follow up, patient is not in extreme pain and is okay with being seen a little later

## 2022-03-27 NOTE — Telephone Encounter (Signed)
Can you help with this please.

## 2022-03-29 NOTE — Telephone Encounter (Signed)
Asked Jeneen Rinks again to please address message

## 2022-03-29 NOTE — Telephone Encounter (Signed)
Tried calling to work in next week. No answer. Unable to LM-VM is full. Patient needs to be worked in to see Dr Lorin Mercy one day next week.

## 2022-07-19 ENCOUNTER — Ambulatory Visit (HOSPITAL_COMMUNITY)
Admission: EM | Admit: 2022-07-19 | Discharge: 2022-07-19 | Disposition: A | Discharge status: Home / Self Care | Payer: BC Managed Care – PPO | Attending: Physician Assistant | Admitting: Physician Assistant

## 2022-07-19 ENCOUNTER — Encounter (HOSPITAL_COMMUNITY): Payer: Self-pay

## 2022-07-19 DIAGNOSIS — J069 Acute upper respiratory infection, unspecified: Secondary | ICD-10-CM

## 2022-07-19 DIAGNOSIS — J9801 Acute bronchospasm: Secondary | ICD-10-CM | POA: Insufficient documentation

## 2022-07-19 DIAGNOSIS — Z28310 Unvaccinated for covid-19: Secondary | ICD-10-CM | POA: Diagnosis not present

## 2022-07-19 DIAGNOSIS — R051 Acute cough: Secondary | ICD-10-CM | POA: Diagnosis present

## 2022-07-19 DIAGNOSIS — Z8616 Personal history of COVID-19: Secondary | ICD-10-CM | POA: Insufficient documentation

## 2022-07-19 DIAGNOSIS — Z1152 Encounter for screening for COVID-19: Secondary | ICD-10-CM | POA: Insufficient documentation

## 2022-07-19 LAB — RESP PANEL BY RT-PCR (FLU A&B, COVID) ARPGX2
Influenza A by PCR: NEGATIVE
Influenza B by PCR: NEGATIVE
SARS Coronavirus 2 by RT PCR: NEGATIVE

## 2022-07-19 MED ORDER — PROMETHAZINE-DM 6.25-15 MG/5ML PO SYRP: 5.0000 mL | ORAL_SOLUTION | Freq: Three times a day (TID) | ORAL | 0 refills | Status: DC | PRN

## 2022-07-19 MED ORDER — ALBUTEROL SULFATE HFA 108 (90 BASE) MCG/ACT IN AERS: 1.0000 | INHALATION_SPRAY | Freq: Four times a day (QID) | RESPIRATORY_TRACT | 0 refills | Status: DC | PRN

## 2022-07-19 MED ORDER — PREDNISONE 20 MG PO TABS: 40.0000 mg | ORAL_TABLET | Freq: Every day | ORAL | 0 refills | Status: AC

## 2022-07-19 NOTE — Discharge Instructions (Signed)
Monitor MyChart for results.  We will contact you if you are positive for COVID or flu.  Use albuterol every 4-6 hours as needed for shortness of breath.  Start prednisone burst tomorrow (07/20/2022).  Do not take NSAIDs with this medication including aspirin, ibuprofen/Advil, naproxen/Aleve.  You can use acetaminophen/Tylenol.  Promethazine DM is for cough.  Do not drive or drink alcohol while taking this medication.  Follow-up with your primary care next week if symptoms have not resolved.  If anything worsens to be seen immediately.

## 2022-07-19 NOTE — ED Triage Notes (Signed)
Pt c/o fever, cough, headache, and fatigue since Monday. States took tylenol today for fever. States had a neg home covid test.

## 2022-07-19 NOTE — ED Provider Notes (Signed)
[facility]    CSN: [id number] Arrival date & time: 07/19/22  1722      History   Chief Complaint Chief Complaint  Patient presents with   Fever   Cough    HPI Luke Mcgrath is a 36 y.o. male.   Patient presents today with a 2-day history of URI symptoms including cough, headache, fatigue, fever, chest tightness.  Denies any nausea, vomiting, chest pain, shortness of breath.  Does report that his daughter has been sick with similar symptoms but does not know what she was ultimately diagnosed with.  He did take an at home COVID test that was negative when symptoms began.  He has had COVID approximately a year ago.  Has not had COVID vaccines.  Denies history of allergies, asthma, COPD.  Does not smoke.  Denies any recent antibiotics or steroids.  He has missed work as a result of symptoms.    Past Medical History:  Diagnosis Date   Anxiety    Cavernous malformation 09/11/2013   Cervical region   GERD (gastroesophageal reflux disease)     Patient Active Problem List   Diagnosis Date Noted   History of fusion of cervical spine 04/13/2021   Protrusion of cervical intervertebral disc 03/25/2021   Carpal tunnel syndrome of left wrist 01/22/2018   Carpal tunnel syndrome of right wrist 01/22/2018   Prurigo nodularis 06/18/2017   GERD (gastroesophageal reflux disease) 10/06/2013   Arteriovenous fistula of spinal cord vessels 08/01/2013   Neuralgia of left thigh 07/01/2013   Chronic anxiety 07/01/2013    Past Surgical History:  Procedure Laterality Date   ANTERIOR CERVICAL DECOMP/DISCECTOMY FUSION N/A 04/13/2021   Procedure: C5-6 ANTERIOR CERVICAL DISCECTOMY FUSION, ALLOGRAFT, PLATE;  Surgeon: [name], MD;  Location: [facility];  Service: Orthopedics;  Laterality: N/A;   BRAIN SURGERY  09/11/1988   age 2- reconstruction   CARPAL TUNNEL RELEASE Left 09/2018   CERVICAL LAMINECTOMY FOR EXCISION OF NEOPLASM  09/11/2013   Cervical cavernoma at C6-7   RECONSTRUCTION  OF NOSE  09/11/1992       Home Medications    Prior to Admission medications   Medication Sig Start Date End Date Taking? Authorizing Provider  albuterol (VENTOLIN HFA) 108 (90 Base) MCG/ACT inhaler Inhale 1-2 puffs into the lungs every 6 (six) hours as needed for wheezing or shortness of breath. 07/19/22  Yes Vernadette Stutsman K, PA-C  predniSONE (DELTASONE) 20 MG tablet Take 2 tablets (40 mg total) by mouth daily for 4 days. 07/19/22 07/23/22 Yes Syre Knerr K, PA-C  promethazine-dextromethorphan (PROMETHAZINE-DM) 6.25-15 MG/5ML syrup Take 5 mLs by mouth 3 (three) times daily as needed for cough. 07/19/22  Yes Hendrix Console K, PA-C  gabapentin (NEURONTIN) 300 MG capsule Take 300 mg by mouth 3 (three) times daily.    [provider]  ibuprofen (ADVIL) 200 MG tablet Take 600-800 mg by mouth every 8 (eight) hours as needed for headache or moderate pain.    [provider]  oxyCODONE-acetaminophen (PERCOCET/ROXICET) 5-325 MG tablet Take 1 tablet by mouth 4 (four) times daily as needed.    [provider]    Family History Family History  Problem Relation Age of Onset   Healthy Mother    Hyperlipidemia Father    Hypertension Father    Diabetes Father    Cancer Maternal Grandmother        colon   Hyperlipidemia Maternal Grandmother    Other Brother        Died,  military services in [place]   Healthy Sister     Social History Social History   Tobacco Use   Smoking status: Never   Smokeless tobacco: Never  Vaping Use   Vaping Use: Never used  Substance Use Topics   Alcohol use: Yes    Comment: 7 drinks a week   Drug use: Not Currently    Types: Marijuana     Allergies   Patient has no known allergies.   Review of Systems Review of Systems  Constitutional:  Positive for activity change, fatigue and fever. Negative for appetite change.  HENT:  Positive for congestion and sore throat. Negative for sinus pressure and sneezing.   Respiratory:  Positive  for cough and chest tightness. Negative for shortness of breath.   Cardiovascular:  Negative for chest pain.  Gastrointestinal:  Negative for abdominal pain, diarrhea, nausea and vomiting.  Neurological:  Positive for headaches. Negative for dizziness and light-headedness.     Physical Exam Triage Vital Signs ED Triage Vitals  Enc Vitals Group     BP 07/19/22 1822 (!) 142/93     Pulse Rate 07/19/22 1822 87     Resp 07/19/22 1822 18     Temp 07/19/22 1822 98.7 F (37.1 C)     Temp Source 07/19/22 1822 Oral     SpO2 07/19/22 1822 97 %     Weight --      Height --      Head Circumference --      Peak Flow --      Pain Score 07/19/22 1823 7     Pain Loc --      Pain Edu? --      Excl. in [place]? --    No data found.  Updated Vital Signs BP (!) 142/93 (BP Location: Left Arm)   Pulse 87   Temp 98.7 F (37.1 C) (Oral)   Resp 18   SpO2 97%   Visual Acuity Right Eye Distance:   Left Eye Distance:   Bilateral Distance:    Right Eye Near:   Left Eye Near:    Bilateral Near:     Physical Exam Vitals reviewed.  Constitutional:      General: He is awake.     Appearance: Normal appearance. He is well-developed. He is not ill-appearing.     Comments: Very pleasant male appears stated age no acute distress sitting comfortably in exam room  HENT:     Head: Normocephalic and atraumatic.     Right Ear: Tympanic membrane, ear canal and external ear normal. Tympanic membrane is not erythematous or bulging.     Left Ear: Tympanic membrane, ear canal and external ear normal. Tympanic membrane is not erythematous or bulging.     Nose: Nose normal.     Mouth/Throat:     Pharynx: Uvula midline. Posterior oropharyngeal erythema present. No oropharyngeal exudate.  Cardiovascular:     Rate and Rhythm: Normal rate and regular rhythm.     Heart sounds: Normal heart sounds, S1 normal and S2 normal. No murmur heard. Pulmonary:     Effort: Pulmonary effort is normal. No accessory muscle usage  or respiratory distress.     Breath sounds: Normal breath sounds. No stridor. No wheezing, rhonchi or rales.     Comments: Reactive cough with deep breathing Neurological:     Mental Status: He is alert.  Psychiatric:        Behavior: Behavior is cooperative.      UC Treatments /  Results  Labs (all labs ordered are listed, but only abnormal results are displayed) Labs Reviewed  RESP PANEL BY RT-PCR (FLU A&B, COVID) [id number]    EKG   Radiology No results found.  Procedures Procedures (including critical care time)  Medications Ordered in UC Medications - No data to display  Initial Impression / Assessment and Plan / UC Course  I have reviewed the triage vital signs and the nursing notes.  Pertinent labs & imaging results that were available during my care of the patient were reviewed by me and considered in my medical decision making (see chart for details).     Patient is well-appearing, afebrile, nontoxic, nontachycardic.  No evidence of acute infection on physical exam that would warrant initiation of antibiotics.  Discussed likely viral etiology.  COVID/flu testing was obtained today-results pending.  Patient was provided work excuse note with current CDC return to work guidelines based on COVID test result.  He was prescribed albuterol to help with cough/chest tightness.  We will also start prednisone burst of 40 mg for 4 days and he was instructed not to take NSAIDs with this medication due to risk of GI bleeding.  He was prescribed promethazine DM for cough with instruction to drink alcohol while taking it.  He can use over-the-counter medication for symptom management.  Discussed that if his symptoms are not improving within a week he should return for reevaluation.  If he has any worsening symptoms he needs to be seen immediately.  Strict return precautions given.  Final Clinical Impressions(s) / UC Diagnoses   Final diagnoses:  Upper respiratory tract infection,  unspecified type  Acute cough  Bronchospasm     Discharge Instructions      Monitor MyChart for results.  We will contact you if you are positive for COVID or flu.  Use albuterol every 4-6 hours as needed for shortness of breath.  Start prednisone burst tomorrow (07/20/2022).  Do not take NSAIDs with this medication including aspirin, ibuprofen/Advil, naproxen/Aleve.  You can use acetaminophen/Tylenol.  Promethazine DM is for cough.  Do not drive or drink alcohol while taking this medication.  Follow-up with your primary care next week if symptoms have not resolved.  If anything worsens to be seen immediately.     ED Prescriptions     Medication Sig Dispense Auth. Provider   albuterol (VENTOLIN HFA) 108 (90 Base) MCG/ACT inhaler Inhale 1-2 puffs into the lungs every 6 (six) hours as needed for wheezing or shortness of breath. 18 g Fredda Clarida K, PA-C   predniSONE (DELTASONE) 20 MG tablet Take 2 tablets (40 mg total) by mouth daily for 4 days. 8 tablet Seini Lannom K, PA-C   promethazine-dextromethorphan (PROMETHAZINE-DM) 6.25-15 MG/5ML syrup Take 5 mLs by mouth 3 (three) times daily as needed for cough. 118 mL Gionni Vaca K, PA-C      PDMP not reviewed this encounter.   Terrilee Croak, PA-C 07/19/22 1932

## 2022-08-10 ENCOUNTER — Emergency Department (HOSPITAL_COMMUNITY)
Admission: EM | Admit: 2022-08-10 | Discharge: 2022-08-10 | Disposition: A | Discharge status: Home / Self Care | Payer: BC Managed Care – PPO | Attending: Emergency Medicine | Admitting: Emergency Medicine

## 2022-08-10 ENCOUNTER — Other Ambulatory Visit: Payer: Self-pay

## 2022-08-10 ENCOUNTER — Emergency Department (HOSPITAL_COMMUNITY): Payer: BC Managed Care – PPO

## 2022-08-10 DIAGNOSIS — R079 Chest pain, unspecified: Secondary | ICD-10-CM | POA: Diagnosis present

## 2022-08-10 DIAGNOSIS — R072 Precordial pain: Secondary | ICD-10-CM | POA: Diagnosis not present

## 2022-08-10 DIAGNOSIS — Z7982 Long term (current) use of aspirin: Secondary | ICD-10-CM | POA: Diagnosis not present

## 2022-08-10 LAB — BASIC METABOLIC PANEL
Anion gap: 9 (ref 5–15)
BUN: 18 mg/dL (ref 6–20)
CO2: 23 mmol/L (ref 22–32)
Calcium: 9.1 mg/dL (ref 8.9–10.3)
Chloride: 106 mmol/L (ref 98–111)
Creatinine, Ser: 0.96 mg/dL (ref 0.61–1.24)
GFR, Estimated: >60 mL/min (ref >60)
Glucose, Bld: 104 mg/dL — ABNORMAL HIGH (ref 70–99)
Potassium: 3.8 mmol/L (ref 3.5–5.1)
Sodium: 138 mmol/L (ref 135–145)

## 2022-08-10 LAB — CBC
HCT: 41.5 % (ref 39.0–52.0)
Hemoglobin: 14.7 g/dL (ref 13.0–17.0)
MCH: 31.5 pg (ref 26.0–34.0)
MCHC: 35.4 g/dL (ref 30.0–36.0)
MCV: 88.9 fL (ref 80.0–100.0)
Platelets: 211 10*3/uL (ref 150–400)
RBC: 4.67 MIL/uL (ref 4.22–5.81)
RDW: 11.4 % — ABNORMAL LOW (ref 11.5–15.5)
WBC: 7.1 10*3/uL (ref 4.0–10.5)
nRBC: 0 % (ref 0.0–0.2)

## 2022-08-10 LAB — TROPONIN I (HIGH SENSITIVITY): Troponin I (High Sensitivity): <2 ng/L (ref <18)

## 2022-08-10 MED ORDER — ASPIRIN 81 MG PO TBEC: 81.0000 mg | DELAYED_RELEASE_TABLET | Freq: Every day | ORAL | 12 refills | Status: AC

## 2022-08-10 NOTE — Discharge Instructions (Addendum)
Start taking an 81 mg Baby Aspirin. I have written a prescription for this  Monitor symptoms over the weekend.  Follow up with Cardiology. You should receive a call to schedule an appointment. If you do not hear from them by end of day Monday, call to schedule an appointment.  Return for new or worsening symptoms
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
## 2022-08-10 NOTE — ED Provider Triage Note (Signed)
Emergency Medicine Provider Triage Evaluation Note  MACEO HERNAN , a 36 y.o. male  was evaluated in triage.  Pt complains of CP that has been ongoing intermittently for 1+ year.   Worse/more frequent over the past week now happening daily.   Was seen by pcp once and had nml EKG.   No pain right now. No SOB rn. No NV.   Some past surg hx from back . Neck brain surgeries.   Review of Systems  Positive: CP Negative: Fever   Physical Exam  Ht 6' (1.829 m)   Wt 79.4 kg   BMI 23.73 kg/m  Gen:   Awake, no distress   Resp:  Normal effort  MSK:   Moves extremities without difficulty  Other:  Lungs clear, somewhat rapid speech.   Medical Decision Making  Medically screening exam initiated at 8:17 PM.  Appropriate orders placed.  DIONYSIOS MASSMAN was informed that the remainder of the evaluation will be completed by another provider, this initial triage assessment does not replace that evaluation, and the importance of remaining in the ED until their evaluation is complete.  Well appearing. NML vitals.  Labs, EKG, cxr   Tedd Sias, [place] 08/10/22 2021

## 2022-08-10 NOTE — ED Notes (Signed)
ED provider at bedside.

## 2022-08-10 NOTE — ED Provider Notes (Signed)
[facility] EMERGENCY DEPARTMENT Provider Note   CSN: [id number] Arrival date & time: 08/10/22  2000    History  Chief Complaint  Patient presents with   Chest Pain    Luke Mcgrath is a 36 y.o. male with history significant for neck and back pain followed by orthopedics here for evaluation of chest pain.  Has had intermittent chest pain over the last year or so.  Nonexertional, nonpleuritic in nature.  Occurs randomly.  States it feels like a lightening bolt that goes from the center of his chest down into his arm.  No associated shortness of breath, diaphoresis, emesis.  No swelling to extremities.  Specific enticing events.  Was seen in 2022 by his PCP for that has had Holter monitor which did not show any significant findings.  He is currently chest pain-free.  Symptoms happening more frequently.  No fever, back pain, numbness, weakness, abdominal pain.  Does appear anxious  HPI     Home Medications Prior to Admission medications   Medication Sig Start Date End Date Taking? Authorizing Provider  aspirin EC 81 MG tablet Take 1 tablet (81 mg total) by mouth daily. Swallow whole. 08/10/22  Yes Jenea Dake A, PA-C  albuterol (VENTOLIN HFA) 108 (90 Base) MCG/ACT inhaler Inhale 1-2 puffs into the lungs every 6 (six) hours as needed for wheezing or shortness of breath. 07/19/22   Raspet, Derry Skill, PA-C  gabapentin (NEURONTIN) 300 MG capsule Take 300 mg by mouth 3 (three) times daily.    [provider]  ibuprofen (ADVIL) 200 MG tablet Take 600-800 mg by mouth every 8 (eight) hours as needed for headache or moderate pain.    [provider]  oxyCODONE-acetaminophen (PERCOCET/ROXICET) 5-325 MG tablet Take 1 tablet by mouth 4 (four) times daily as needed.    [provider]  promethazine-dextromethorphan (PROMETHAZINE-DM) 6.25-15 MG/5ML syrup Take 5 mLs by mouth 3 (three) times daily as needed for cough. 07/19/22   Raspet, Derry Skill, PA-C       Allergies    Patient has no known allergies.    Review of Systems   Review of Systems  Constitutional: Negative.   HENT: Negative.    Respiratory: Negative.    Cardiovascular:  Positive for chest pain. Negative for palpitations and leg swelling.  Gastrointestinal: Negative.   Genitourinary: Negative.   Musculoskeletal: Negative.   Skin: Negative.   Neurological: Negative.   All other systems reviewed and are negative.   Physical Exam Updated Vital Signs BP (!) 131/94   Pulse 73   Temp 98.5 F (36.9 C) (Oral)   Resp 16   Ht 6' (1.829 m)   Wt 79.4 kg   SpO2 98%   BMI 23.73 kg/m  Physical Exam Vitals and nursing note reviewed.  Constitutional:      General: He is not in acute distress.    Appearance: He is well-developed. He is not ill-appearing, toxic-appearing or diaphoretic.  HENT:     Head: Atraumatic.  Eyes:     Pupils: Pupils are equal, round, and reactive to light.  Cardiovascular:     Rate and Rhythm: Normal rate and regular rhythm.     Pulses:          Radial pulses are 2+ on the right side and 2+ on the left side.     Heart sounds: Normal heart sounds.  Pulmonary:     Effort: Pulmonary effort is normal. No respiratory distress.     Breath sounds:  Normal breath sounds.     Comments: Clear bil, speaks in full sentences without difficulty Chest:     Comments: Non tender without crepitus, step off Abdominal:     General: Bowel sounds are normal. There is no distension.     Palpations: Abdomen is soft.     Comments: Soft non tender  Musculoskeletal:        General: Normal range of motion.     Cervical back: Normal range of motion and neck supple.     Right lower leg: No tenderness. No edema.     Left lower leg: No tenderness. No edema.     Comments: Full ROM, No bony tenderness, calves soft, non tender  Skin:    General: Skin is warm and dry.     Capillary Refill: Capillary refill takes less than 2 seconds.     Comments: No rashes or lesions   Neurological:     General: No focal deficit present.     Mental Status: He is alert and oriented to person, place, and time.    ED Results / Procedures / Treatments   Labs (all labs ordered are listed, but only abnormal results are displayed) Labs Reviewed  BASIC METABOLIC PANEL - Abnormal; Notable for the following components:      Result Value   Glucose, Bld 104 (*)    All other components within normal limits  CBC - Abnormal; Notable for the following components:   RDW 11.4 (*)    All other components within normal limits  TROPONIN I (HIGH SENSITIVITY)    EKG None  Radiology DG Chest 2 View  Result Date: 08/10/2022 CLINICAL DATA:  chest pain EXAM: CHEST - 2 VIEW COMPARISON:  Chest x-ray 07/14/2008 FINDINGS: The heart and mediastinal contours are within normal limits. No focal consolidation. No pulmonary edema. No pleural effusion. No pneumothorax. No acute osseous abnormality. IMPRESSION: No active cardiopulmonary disease. Electronically Signed   By: Iven Finn M.D.   On: 08/10/2022 20:43    Procedures Procedures    Medications Ordered in ED Medications - No data to display  ED Course/ Medical Decision Making/ A&P     36 year old here for evaluation of chest pain.  Intermittent over the last year however most recently occurring more frequently.  Describes as a shocklike sensation across his chest into his left arm.  No recent injury or trauma.  Symptoms are nonexertional, nonpleuritic in nature.  Can occur randomly.  Not worse with movement.  No numbness or weakness.  Does have some chronic back pain however no new back pain.  She was seen by PCP in 2022 for this had Holter monitor which did not show any significant arrhythmia per patient.  Does not appear grossly fluid overloaded low suspicion for fluid overload.  He has no murmur, fever low suspicion for endocarditis.  PERC negative, Wells criteria low risk, low suspicion for PE, VTE.  Labs and imaging personally  viewed and interpreted:  EKG without ischemic changes Chest x-ray without leukocytosis Metabolic panel without significant abnormality Troponin less than 2  Discussed results with patient, friend in room.  Unclear etiology of symptoms.  At this time I low suspicion for acute ACS, PE, dissection, pneumothorax, myocarditis, pericarditis, fluid overload, boerhaave, unstable angina.  Given symptoms x1 year we will have him follow-up with cardiology however I have low suspicion for acute emergent pathology which would require inpatient management or surgical intervention from the emergency department standpoint.  We will have him start a baby  aspirin.  He will return for any new or worsening symptoms.  Cardiology consult placed for outpatient FU.  The patient has been appropriately medically screened and/or stabilized in the ED. I have low suspicion for any other emergent medical condition which would require further screening, evaluation or treatment in the ED or require inpatient management.  Patient is hemodynamically stable and in no acute distress.  Patient able to ambulate in department prior to ED.  Evaluation does not show acute pathology that would require ongoing or additional emergent interventions while in the emergency department or further inpatient treatment.  I have discussed the diagnosis with the patient and answered all questions.  Pain is been managed while in the emergency department and patient has no further complaints prior to discharge.  Patient is comfortable with plan discussed in room and is stable for discharge at this time.  I have discussed strict return precautions for returning to the emergency department.  Patient was encouraged to follow-up with PCP/specialist refer to at discharge.                            Medical Decision Making Amount and/or Complexity of Data Reviewed Independent Historian: friend External Data Reviewed: labs, radiology, ECG and notes. Labs:  ordered. Decision-making details documented in ED Course. Radiology: ordered and independent interpretation performed. Decision-making details documented in ED Course. ECG/medicine tests: ordered and independent interpretation performed. Decision-making details documented in ED Course.  Risk OTC drugs. Prescription drug management. Decision regarding hospitalization. Diagnosis or treatment significantly limited by social determinants of health.         Final Clinical Impression(s) / ED Diagnoses Final diagnoses:  Precordial pain    Rx / DC Orders ED Discharge Orders          Ordered    aspirin EC 81 MG tablet  Daily        08/10/22 2217    Ambulatory referral to Cardiology       Comments: If you have not heard from the Cardiology office within the next 72 hours please call [phone].   08/10/22 2218              Chrysta Fulcher A, PA-C 08/10/22 2225    Fredia Sorrow, MD 08/12/22 0002

## 2022-08-11 ENCOUNTER — Telehealth: Payer: Self-pay

## 2022-08-11 NOTE — Telephone Encounter (Signed)
Transition Care Management Follow-up Telephone Call Date of discharge and from where: 08/10/22 from [facility] ED for Precordial pain How have you been since you were released the hospital? Pt states he has something going on in his chest. Pt states he trying to be seen Any questions or concerns? No  Items Reviewed: Did the pt receive and understand the discharge instructions provided? Yes Reviewed when to go to the ED if changes occur over the weekend. Medications obtained and verified? Yes  Other? No  Any new allergies since your discharge? No  Dietary orders reviewed? No Do you have support at home? Yes    Follow up appointments reviewed:  PCP Hospital f/u appt confirmed? Yes  Scheduled to see Dr Elease Hashimoto on 08/16/22 @ 4:30. [facility] f/u appt confirmed? Yes  Scheduled to see Dr Marlou Porch on 08/14/22 @ 2:30. Are transportation arrangements needed? No  If their condition worsens, is the pt aware to call PCP or go to the Emergency Dept.? Yes Was the patient provided with contact information for the PCP's office or ED? Yes Was to pt encouraged to call back with questions or concerns? Yes

## 2022-08-14 ENCOUNTER — Encounter: Payer: Self-pay | Admitting: Cardiology

## 2022-08-14 ENCOUNTER — Ambulatory Visit: Payer: BC Managed Care – PPO | Attending: Cardiology | Admitting: Cardiology

## 2022-08-14 VITALS — BP 104/66 | HR 58 | Ht 72.0 in | Wt 172.0 lb

## 2022-08-14 DIAGNOSIS — R072 Precordial pain: Secondary | ICD-10-CM | POA: Diagnosis not present

## 2022-08-14 DIAGNOSIS — F419 Anxiety disorder, unspecified: Secondary | ICD-10-CM | POA: Diagnosis not present

## 2022-08-14 MED ORDER — METOPROLOL TARTRATE 25 MG PO TABS: 25.0000 mg | ORAL_TABLET | ORAL | 0 refills | Status: DC

## 2022-08-14 NOTE — Progress Notes (Signed)
Cardiology Office Note:    Date:  08/14/2022   ID:  Luke Mcgrath, DOB 07/11/1986, MRN [id number]  PCP:  Luke Post, MD   [facility] Cardiologist:  Luke Furbish, MD     Referring MD: Luke Post, MD    History of Present Illness:    Luke Mcgrath is a 36 y.o. male here for the evaluation of chest pain at the request of Dr. Elease Hashimoto.  He was recently in the emergency department on 08/10/2022 with intermittent chest discomfort  with COVID over the last year or so that was nonexertional nonpleuritic randomly occurring.  Feels like a lightening bolt electricle that goes from the center of his chest down his arm.  No shortness of breath diaphoresis or emesis.  No specific inciting events.  Back in 2022 he had a Holter monitor that did not show any significant findings.  Symptoms seem to be happening more frequently.  No recent trauma.  Job stressful.  He works for [employer], Industrial/product designer. His troponin was normal less than 2, EKG showed no ischemic changes  He was asked to follow-up with cardiology for further evaluation.  They started him on a baby aspirin  Family with HTN.   Past Medical History:  Diagnosis Date   Anxiety    Cavernous malformation 09/11/2013   Cervical region   GERD (gastroesophageal reflux disease)     Past Surgical History:  Procedure Laterality Date   ANTERIOR CERVICAL DECOMP/DISCECTOMY FUSION N/A 04/13/2021   Procedure: C5-6 ANTERIOR CERVICAL DISCECTOMY FUSION, ALLOGRAFT, PLATE;  Surgeon: [name], MD;  Location: [facility];  Service: Orthopedics;  Laterality: N/A;   BRAIN SURGERY  09/11/1988   age 24- reconstruction   CARPAL TUNNEL RELEASE Left 09/2018   CERVICAL LAMINECTOMY FOR EXCISION OF NEOPLASM  09/11/2013   Cervical cavernoma at C6-7   RECONSTRUCTION OF NOSE  09/11/1992    Current Medications: Current Meds  Medication Sig   aspirin EC 81 MG tablet Take 1 tablet (81 mg total) by mouth  daily. Swallow whole.   gabapentin (NEURONTIN) 300 MG capsule Take 300 mg by mouth 3 (three) times daily.   ibuprofen (ADVIL) 200 MG tablet Take 600-800 mg by mouth every 8 (eight) hours as needed for headache or moderate pain.   metoprolol tartrate (LOPRESSOR) 25 MG tablet Take 1 tablet (25 mg total) by mouth as directed. Take 1 tablet 2 hours before your CT scan   oxyCODONE-acetaminophen (PERCOCET/ROXICET) 5-325 MG tablet Take 1 tablet by mouth 4 (four) times daily as needed.     Allergies:   Patient has no known allergies.   Social History   Socioeconomic History   Marital status: Divorced    Spouse name: Not on file   Number of children: Not on file   Years of education: Not on file   Highest education level: Not on file  Occupational History   Not on file  Tobacco Use   Smoking status: Never   Smokeless tobacco: Never  Vaping Use   Vaping Use: Never used  Substance and Sexual Activity   Alcohol use: Yes    Comment: 7 drinks a week   Drug use: Not Currently    Types: Marijuana   Sexual activity: Not on file  Other Topics Concern   Not on file  Social History Narrative   Lives with wife and two children.   He works for an [employer].       Social Determinants  of Health   Financial Resource Strain: Not on file  Food Insecurity: Not on file  Transportation Needs: Not on file  Physical Activity: Not on file  Stress: Not on file  Social Connections: Not on file     Family History: The patient's family history includes Cancer in his maternal grandmother; Diabetes in his father; Healthy in his mother and sister; Hyperlipidemia in his father and maternal grandmother; Hypertension in his father; Other in his brother.  ROS:   Please see the history of present illness.     All other systems reviewed and are negative.  EKGs/Labs/Other Studies Reviewed:    The following studies were reviewed today: EKG ER visit lab work  EKG:  The ekg ordered today demonstrates  08/14/2022-sinus bradycardia with heart rate of 58 bpm Minor sinus arrhythmia.  Subtle Q-wave nonpathologic in aVF.  Recent Labs: 08/10/2022: BUN 18; Creatinine, Ser 0.96; Hemoglobin 14.7; Platelets 211; Potassium 3.8; Sodium 138  Recent Lipid Panel    Component Value Date/Time   CHOL 203 (H) 09/21/2016 0929   TRIG 122.0 09/21/2016 0929   HDL 54.20 09/21/2016 0929   CHOLHDL 4 09/21/2016 0929   VLDL 24.4 09/21/2016 0929   LDLCALC 124 (H) 09/21/2016 0929   LDLDIRECT 144.4 05/23/2013 0830     Risk Assessment/Calculations:               Physical Exam:    VS:  BP 104/66 (BP Location: Left Arm, Patient Position: Sitting, Cuff Size: Normal)   Pulse (!) 58   Ht 6' (1.829 m)   Wt 172 lb (78 kg)   BMI 23.33 kg/m     Wt Readings from Last 3 Encounters:  08/14/22 172 lb (78 kg)  08/10/22 175 lb (79.4 kg)  08/12/21 168 lb 11.2 oz (76.5 kg)     GEN:  Well nourished, well developed in no acute distress HEENT: Normal NECK: No JVD; No carotid bruits LYMPHATICS: No lymphadenopathy CARDIAC: RRR, no murmurs, rubs, gallops RESPIRATORY:  Clear to auscultation without rales, wheezing or rhonchi  ABDOMEN: Soft, non-tender, non-distended MUSCULOSKELETAL:  No edema; No deformity  SKIN: Warm and dry NEUROLOGIC:  Alert and oriented x 3 PSYCHIATRIC:  Normal affect   ASSESSMENT:    1. Precordial pain   2. Chronic anxiety    PLAN:    In order of problems listed above:  Precordial chest pain - Does have atypical features such as sharp shooting discomfort, could be neuropathic in origin or musculoskeletal.  Nonetheless, this has been recurrent over the past year and quite worrisome to him.  I think it makes sense for [place] to further evaluate with coronary CT scan to ensure that he does not have any anomalous coronary arteries/disease present. -We will check an echocardiogram to ensure proper structure and function of his heart as well.  Lets make sure that he does not have any evidence of  pericardial effusion etc. -It is possible that his pain is neuropathic in origin.  He has had a C-spine fusion in the past and he also has some issues with his thoracic spine.  Ultimately, I want to ensure that he does not have any early cardiac disease.  Abnormal EKG - His EKG here and in the ER was computer interpreted as inferior infarct age undetermined.  He does have a Q-wave in lead III however he has small nonpathologic Q waves in aVF.  Echocardiogram will solidify inferior wall motion.  I believe that this is spurious interpretation from the EKG machine.  Anxiety - Can be playing a role as well in symptomatology.  We will follow-up with results of studies.          Medication Adjustments/Labs and Tests Ordered: Current medicines are reviewed at length with the patient today.  Concerns regarding medicines are outlined above.  Orders Placed This Encounter  Procedures   CT CORONARY MORPH W/CTA COR W/SCORE W/CA W/CM &/OR WO/CM   EKG 12-Lead   ECHOCARDIOGRAM COMPLETE   Meds ordered this encounter  Medications   metoprolol tartrate (LOPRESSOR) 25 MG tablet    Sig: Take 1 tablet (25 mg total) by mouth as directed. Take 1 tablet 2 hours before your CT scan    Dispense:  1 tablet    Refill:  0    Patient Instructions  Medication Instructions:  The current medical regimen is effective;  continue present plan and medications.  *If you need a refill on your cardiac medications before your next appointment, please call your pharmacy*  Testing/Procedures: Your physician has requested that you have an echocardiogram. Echocardiography is a painless test that uses sound waves to create images of your heart. It provides your doctor with information about the size and shape of your heart and how well your heart's chambers and valves are working. This procedure takes approximately one hour. There are no restrictions for this procedure. Please do NOT wear cologne, perfume, aftershave, or  lotions (deodorant is allowed). Please arrive 15 minutes prior to your appointment time.    Your cardiac CT will be scheduled at:   [facility] [address] [place], [place] [address] [phone]  Please arrive at the [facility] and Children's Entrance (Entrance C2) of [facility] 30 minutes prior to test start time. You can use the FREE valet parking offered at entrance C (encouraged to control the heart rate for the test)  Proceed to the [facility] Radiology Department (first floor) to check-in and test prep.  All radiology patients and guests should use entrance C2 at [facility], accessed from [facility], even though the hospital's physical address listed is [address].     Please follow these instructions carefully (unless otherwise directed):  Hold all erectile dysfunction medications at least 3 days (72 hrs) prior to test. (Ie viagra, cialis, sildenafil, tadalafil, etc) We will administer nitroglycerin during this exam.   On the Night Before the Test: Be sure to Drink plenty of water. Do not consume any caffeinated/decaffeinated beverages or chocolate 12 hours prior to your test. Do not take any antihistamines 12 hours prior to your test.  On the Day of the Test: Drink plenty of water until 1 hour prior to the test. Do not eat any food 1 hour prior to test. You may take your regular medications prior to the test.  Take metoprolol (Lopressor) two hours prior to test. HOLD Furosemide/Hydrochlorothiazide morning of the test.      After the Test: Drink plenty of water. After receiving IV contrast, you may experience a mild flushed feeling. This is normal. On occasion, you may experience a mild rash up to 24 hours after the test. This is not dangerous. If this occurs, you can take Benadryl 25 mg and increase your fluid intake. If you experience trouble breathing, this can be serious. If it is severe call 911  IMMEDIATELY. If it is mild, please call our office.  We will call to schedule your test 2-4 weeks out understanding that some insurance companies will need  an authorization prior to the service being performed.   For non-scheduling related questions, please contact the cardiac imaging nurse navigator should you have any questions/concerns: Marchia Bond, Cardiac Imaging Nurse Navigator Gordy Clement, Cardiac Imaging Nurse Navigator [facility] Direct Office Dial: [phone]   For scheduling needs, including cancellations and rescheduling, please call [place], [phone].   Follow-Up: At [facility], you and your health needs are our priority.  As part of our continuing mission to provide you with exceptional heart care, we have created designated Provider Care Teams.  These Care Teams include your primary Cardiologist (physician) and Advanced Practice Providers (APPs -  Physician Assistants and Nurse Practitioners) who all work together to provide you with the care you need, when you need it.  We recommend signing up for the patient portal called "MyChart".  Sign up information is provided on this After Visit Summary.  MyChart is used to connect with patients for Virtual Visits (Telemedicine).  Patients are able to view lab/test results, encounter notes, upcoming appointments, etc.  Non-urgent messages can be sent to your provider as well.   To learn more about what you can do with MyChart, go to [email or web address].    Your next appointment:   Follow up will be based on the results of the above testing.   Important Information About Sugar         Signed, Luke Furbish, MD  08/14/2022 5:15 PM    [place]

## 2022-08-14 NOTE — Patient Instructions (Signed)
Medication Instructions:  The current medical regimen is effective;  continue present plan and medications.  *If you need a refill on your cardiac medications before your next appointment, please call your pharmacy*  Testing/Procedures: Your physician has requested that you have an echocardiogram. Echocardiography is a painless test that uses sound waves to create images of your heart. It provides your doctor with information about the size and shape of your heart and how well your heart's chambers and valves are working. This procedure takes approximately one hour. There are no restrictions for this procedure. Please do NOT wear cologne, perfume, aftershave, or lotions (deodorant is allowed). Please arrive 15 minutes prior to your appointment time.    Your cardiac CT will be scheduled at:   [facility] [address] [place], [place] [address] [phone]  Please arrive at the [facility] and Children's Entrance (Entrance C2) of [facility] 30 minutes prior to test start time. You can use the FREE valet parking offered at entrance C (encouraged to control the heart rate for the test)  Proceed to the [facility] Radiology Department (first floor) to check-in and test prep.  All radiology patients and guests should use entrance C2 at [facility], accessed from [facility], even though the hospital's physical address listed is [address].     Please follow these instructions carefully (unless otherwise directed):  Hold all erectile dysfunction medications at least 3 days (72 hrs) prior to test. (Ie viagra, cialis, sildenafil, tadalafil, etc) We will administer nitroglycerin during this exam.   On the Night Before the Test: Be sure to Drink plenty of water. Do not consume any caffeinated/decaffeinated beverages or chocolate 12 hours prior to your test. Do not take any antihistamines 12 hours prior to your test.  On the Day of the  Test: Drink plenty of water until 1 hour prior to the test. Do not eat any food 1 hour prior to test. You may take your regular medications prior to the test.  Take metoprolol (Lopressor) two hours prior to test. HOLD Furosemide/Hydrochlorothiazide morning of the test.      After the Test: Drink plenty of water. After receiving IV contrast, you may experience a mild flushed feeling. This is normal. On occasion, you may experience a mild rash up to 24 hours after the test. This is not dangerous. If this occurs, you can take Benadryl 25 mg and increase your fluid intake. If you experience trouble breathing, this can be serious. If it is severe call 911 IMMEDIATELY. If it is mild, please call our office.  We will call to schedule your test 2-4 weeks out understanding that some insurance companies will need an authorization prior to the service being performed.   For non-scheduling related questions, please contact the cardiac imaging nurse navigator should you have any questions/concerns: Marchia Bond, Cardiac Imaging Nurse Navigator Gordy Clement, Cardiac Imaging Nurse Navigator [facility] Direct Office Dial: [phone]   For scheduling needs, including cancellations and rescheduling, please call [place], [phone].   Follow-Up: At [facility], you and your health needs are our priority.  As part of our continuing mission to provide you with exceptional heart care, we have created designated Provider Care Teams.  These Care Teams include your primary Cardiologist (physician) and Advanced Practice Providers (APPs -  Physician Assistants and Nurse Practitioners) who all work together to provide you with the care you need, when you need it.  We  recommend signing up for the patient portal called "MyChart".  Sign up information is provided on this After Visit Summary.  MyChart is used to connect with patients for Virtual Visits (Telemedicine).   Patients are able to view lab/test results, encounter notes, upcoming appointments, etc.  Non-urgent messages can be sent to your provider as well.   To learn more about what you can do with MyChart, go to [email or web address].    Your next appointment:   Follow up will be based on the results of the above testing.   Important Information About Sugar

## 2022-08-15 ENCOUNTER — Other Ambulatory Visit: Payer: Self-pay | Admitting: Cardiology

## 2022-08-16 ENCOUNTER — Inpatient Hospital Stay: Payer: BC Managed Care – PPO | Admitting: Family Medicine

## 2022-09-05 ENCOUNTER — Telehealth (HOSPITAL_COMMUNITY): Payer: Self-pay | Admitting: *Deleted

## 2022-09-05 ENCOUNTER — Ambulatory Visit (HOSPITAL_BASED_OUTPATIENT_CLINIC_OR_DEPARTMENT_OTHER): Payer: BC Managed Care – PPO

## 2022-09-05 DIAGNOSIS — R072 Precordial pain: Secondary | ICD-10-CM

## 2022-09-05 LAB — ECHOCARDIOGRAM COMPLETE
Area-P 1/2: 2.07 cm2
S' Lateral: 3.6 cm

## 2022-09-05 NOTE — Telephone Encounter (Signed)
Reaching out to patient to offer assistance regarding upcoming cardiac imaging study; pt verbalizes understanding of appt date/time, parking situation and where to check in, pre-test NPO status, and verified current allergies; name and call back number provided for further questions should they arise  Gordy Clement RN Navigator [facility] [phone] office [phone] cell  Patient aware to arrive at 1:30pm.

## 2022-09-06 ENCOUNTER — Ambulatory Visit (HOSPITAL_COMMUNITY)
Admission: RE | Admit: 2022-09-06 | Discharge: 2022-09-06 | Disposition: A | Discharge status: Home / Self Care | Payer: BC Managed Care – PPO | Source: Ambulatory Visit | Attending: Cardiology | Admitting: Cardiology

## 2022-09-06 DIAGNOSIS — R072 Precordial pain: Secondary | ICD-10-CM | POA: Diagnosis not present

## 2022-09-06 MED ORDER — DILTIAZEM HCL 25 MG/5ML IV SOLN: 5.0000 mg | INTRAVENOUS | Status: DC | PRN

## 2022-09-06 MED ORDER — NITROGLYCERIN 0.4 MG SL SUBL
0.8000 mg | SUBLINGUAL_TABLET | Freq: Once | SUBLINGUAL | Status: AC
  Administered 2022-09-06: 0.8 mg via SUBLINGUAL

## 2022-09-06 MED ORDER — IOHEXOL 350 MG/ML SOLN
100.0000 mL | Freq: Once | INTRAVENOUS | Status: AC | PRN
  Administered 2022-09-06: 100 mL via INTRAVENOUS

## 2022-09-06 MED ORDER — METOPROLOL TARTRATE 5 MG/5ML IV SOLN
INTRAVENOUS | Status: AC
  Administered 2022-09-06: 10 mg via INTRAVENOUS
  Filled 2022-09-06: qty 10

## 2022-09-06 MED ORDER — NITROGLYCERIN 0.4 MG SL SUBL
SUBLINGUAL_TABLET | SUBLINGUAL | Status: AC
  Filled 2022-09-06: qty 2

## 2022-09-06 MED ORDER — METOPROLOL TARTRATE 5 MG/5ML IV SOLN: 5.0000 mg | INTRAVENOUS | Status: DC | PRN

## 2022-10-03 ENCOUNTER — Other Ambulatory Visit: Payer: Self-pay | Admitting: Family Medicine

## 2022-10-17 ENCOUNTER — Ambulatory Visit (INDEPENDENT_AMBULATORY_CARE_PROVIDER_SITE_OTHER): Payer: BC Managed Care – PPO | Admitting: Orthopaedic Surgery

## 2022-10-17 VITALS — BP 144/79 | HR 72 | Ht 72.0 in | Wt 170.0 lb

## 2022-10-17 DIAGNOSIS — M5136 Other intervertebral disc degeneration, lumbar region: Secondary | ICD-10-CM | POA: Diagnosis not present

## 2022-10-17 DIAGNOSIS — M51369 Other intervertebral disc degeneration, lumbar region without mention of lumbar back pain or lower extremity pain: Secondary | ICD-10-CM | POA: Insufficient documentation

## 2022-10-17 NOTE — Progress Notes (Signed)
Office Visit Note   Patient: Luke Mcgrath           Date of Birth: 1986/05/10           MRN: [id number] Visit Date: 10/17/2022              Requested by: Eulas Post, MD [place],  [place] [address] PCP: Eulas Post, MD   Assessment & Plan: Visit Diagnoses:  1. Other intervertebral disc degeneration, lumbar region     Plan: Patient would like to proceed with lumbar MRI scan.  May be candidate for an epidural he is already been through chiropractic therapy , activity modification, medication with persistent symptoms greater than 4 months  Follow-Up Instructions: Office follow-up after lumbar MRI.  Orders:  No orders of the defined types were placed in this encounter.  No orders of the defined types were placed in this encounter.     Procedures: No procedures performed   Clinical Data: No additional findings.   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  My skin gave him slight improvement he states he does not like the way it makes him feel he is taken some Aleve and ibuprofen in the past.  Is been on muscle relaxants had chiropractic treatment.  Previous CT 5 6 cervical fusion for a disc degeneration with herniation.  He had additional degenerative levels in his cervical spine.  He has had back pain pain that radiates down his left leg with some leg weakness.  Is able to walk up stairs.  Patient states cervical spine is doing great.  Plan lumbar radiographs 03/24/2022 showed normal curvature maintained disc base height no spondylolisthesis.  Review of Systems all systems update noncontributory.  Previous cervical fusion C5-6 on 04/13/2021.   Objective: Vital Signs: BP (!) 144/79   Pulse 72   Ht 6' (1.829  m)   Wt 170 lb (77.1 kg)   BMI 23.06 kg/m   Physical Exam Constitutional:      Appearance: He is well-developed.  HENT:     Head: Normocephalic and atraumatic.     Right Ear: External ear normal.     Left Ear: External ear normal.  Eyes:     Pupils: Pupils are equal, round, and reactive to light.  Neck:     Thyroid: No thyromegaly.     Trachea: No tracheal deviation.  Cardiovascular:     Rate and Rhythm: Normal rate.  Pulmonary:     Effort: Pulmonary effort is normal.     Breath sounds: No wheezing.  Abdominal:     General: Bowel sounds are normal.     Palpations: Abdomen is soft.  Musculoskeletal:     Cervical back: Neck supple.  Skin:    General: Skin is warm and dry.     Capillary Refill: Capillary refill takes less than 2 seconds.  Neurological:     Mental Status: He is alert and oriented to person, place, and time.  Psychiatric:        Behavior: Behavior normal.        Thought Content: Thought content normal.        Judgment: Judgment normal.     Ortho Exam  patient has sciatic notch tenderness on the left 80 degrees positive popliteal compression test moderate sciatic notch tenderness.  He can heel and toe walk.  Normal hip range of motion knees reach full extension.  Specialty Comments:  No specialty comments available.  Imaging: No results found.   PMFS History: Patient Active Problem List   Diagnosis Date Noted   Other intervertebral disc degeneration, lumbar region 10/17/2022   History of fusion of cervical spine 04/13/2021   Protrusion of cervical intervertebral disc 03/25/2021   Carpal tunnel syndrome of left wrist 01/22/2018   Carpal tunnel syndrome of right wrist 01/22/2018   Prurigo nodularis 06/18/2017   GERD (gastroesophageal reflux disease) 10/06/2013   Arteriovenous fistula of spinal cord vessels 08/01/2013   Neuralgia of left thigh 07/01/2013   Chronic anxiety 07/01/2013   Past Medical History:  Diagnosis Date   Anxiety    Cavernous  malformation 09/11/2013   Cervical region   GERD (gastroesophageal reflux disease)     Family History  Problem Relation Age of Onset   Healthy Mother    Hyperlipidemia Father    Hypertension Father    Diabetes Father    Cancer Maternal Grandmother        colon   Hyperlipidemia Maternal Grandmother    Other Brother        Died, Nature conservation officer services in [place]   Healthy Sister     Past Surgical History:  Procedure Laterality Date   ANTERIOR CERVICAL DECOMP/DISCECTOMY FUSION N/A 04/13/2021   Procedure: C5-6 ANTERIOR CERVICAL DISCECTOMY FUSION, ALLOGRAFT, PLATE;  Surgeon: [name], MD;  Location: [place];  Service: Orthopedics;  Laterality: N/A;   BRAIN SURGERY  09/11/1988   age 52- reconstruction   CARPAL TUNNEL RELEASE Left 09/2018   CERVICAL LAMINECTOMY FOR EXCISION OF NEOPLASM  09/11/2013   Cervical cavernoma at C6-7   RECONSTRUCTION OF NOSE  09/11/1992   Social History   Occupational History   Not on file  Tobacco Use   Smoking status: Never   Smokeless tobacco: Never  Vaping Use   Vaping Use: Never used  Substance and Sexual Activity   Alcohol use: Yes    Comment: 7 drinks a week   Drug use: Not Currently    Types: Marijuana   Sexual activity: Not on file

## 2022-10-17 NOTE — Addendum Note (Signed)
Addended by: Meyer Cory on: 10/17/2022 08:58 AM   Modules accepted: Orders

## 2022-10-28 ENCOUNTER — Ambulatory Visit
Admission: RE | Admit: 2022-10-28 | Discharge: 2022-10-28 | Disposition: A | Discharge status: Home / Self Care | Payer: BC Managed Care – PPO | Source: Ambulatory Visit | Attending: Orthopaedic Surgery | Admitting: Orthopaedic Surgery

## 2022-10-28 DIAGNOSIS — M5136 Other intervertebral disc degeneration, lumbar region: Secondary | ICD-10-CM

## 2022-11-07 ENCOUNTER — Ambulatory Visit: Payer: BC Managed Care – PPO | Admitting: Orthopaedic Surgery

## 2022-11-19 ENCOUNTER — Ambulatory Visit (HOSPITAL_COMMUNITY)
Admission: EM | Admit: 2022-11-19 | Discharge: 2022-11-19 | Disposition: A | Discharge status: Home / Self Care | Payer: BC Managed Care – PPO | Attending: Emergency Medicine | Admitting: Emergency Medicine

## 2022-11-19 ENCOUNTER — Encounter (HOSPITAL_COMMUNITY): Payer: Self-pay

## 2022-11-19 DIAGNOSIS — B349 Viral infection, unspecified: Secondary | ICD-10-CM | POA: Diagnosis present

## 2022-11-19 LAB — POCT RAPID STREP A, ED / UC: Streptococcus, Group A Screen (Direct): NEGATIVE

## 2022-11-19 NOTE — ED Provider Notes (Signed)
Luke Mcgrath    CSN: [id number] Arrival date & time: 11/19/22  1130     History   Chief Complaint Chief Complaint  Patient presents with   Fever   Sore Throat    HPI Luke Mcgrath is a 37 y.o. male.  Here with 3 day history of sore throat, headache, congestion, body aches. Pain is 7/10 Fever tmax 102 on Friday  No vomiting/diarrhea. Tolerating fluids Has been using tylenol/ibuprofen, dayquil Positive sick contacts, kids had strep and flu  Past Medical History:  Diagnosis Date   Anxiety    Cavernous malformation 09/11/2013   Cervical region   GERD (gastroesophageal reflux disease)     Patient Active Problem List   Diagnosis Date Noted   Other intervertebral disc degeneration, lumbar region 10/17/2022   History of fusion of cervical spine 04/13/2021   Protrusion of cervical intervertebral disc 03/25/2021   Carpal tunnel syndrome of left wrist 01/22/2018   Carpal tunnel syndrome of right wrist 01/22/2018   Prurigo nodularis 06/18/2017   GERD (gastroesophageal reflux disease) 10/06/2013   Arteriovenous fistula of spinal cord vessels 08/01/2013   Neuralgia of left thigh 07/01/2013   Chronic anxiety 07/01/2013    Past Surgical History:  Procedure Laterality Date   ANTERIOR CERVICAL DECOMP/DISCECTOMY FUSION N/A 04/13/2021   Procedure: C5-6 ANTERIOR CERVICAL DISCECTOMY FUSION, ALLOGRAFT, PLATE;  Surgeon: [name], MD;  Location: [place];  Service: Orthopedics;  Laterality: N/A;   BRAIN SURGERY  09/11/1988   age 42- reconstruction   CARPAL TUNNEL RELEASE Left 09/2018   CERVICAL LAMINECTOMY FOR EXCISION OF NEOPLASM  09/11/2013   Cervical cavernoma at C6-7   RECONSTRUCTION OF NOSE  09/11/1992       Home Medications    Prior to Admission medications   Medication Sig Start Date End Date Taking? Authorizing Provider  gabapentin (NEURONTIN) 300 MG capsule Take 300 mg by mouth 3 (three) times daily.   Yes [provider]  ibuprofen (ADVIL) 200  MG tablet Take 600-800 mg by mouth every 8 (eight) hours as needed for headache or moderate pain.   Yes [provider]  metoprolol tartrate (LOPRESSOR) 25 MG tablet Take 1 tablet (25 mg total) by mouth as directed. Take 1 tablet 2 hours before your CT scan 08/14/22  Yes Jerline Pain, MD  aspirin EC 81 MG tablet Take 1 tablet (81 mg total) by mouth daily. Swallow whole. Patient not taking: Reported on 10/17/2022 08/10/22   Henderly, Britni A, PA-C  oxyCODONE-acetaminophen (PERCOCET/ROXICET) 5-325 MG tablet Take 1 tablet by mouth 4 (four) times daily as needed. Patient not taking: Reported on 10/17/2022    [provider]    Family History Family History  Problem Relation Age of Onset   Healthy Mother    Hyperlipidemia Father    Hypertension Father    Diabetes Father    Cancer Maternal Grandmother        colon   Hyperlipidemia Maternal Grandmother    Other Brother        Died, Nature conservation officer services in [place]   Healthy Sister     Social History Social History   Tobacco Use   Smoking status: Never   Smokeless tobacco: Never  Vaping Use   Vaping Use: Never used  Substance Use Topics   Alcohol use: Yes    Comment: 7 drinks a week   Drug use: Not Currently    Types: Marijuana     Allergies   Patient has no known allergies.  Review of Systems Review of Systems As per HPI  Physical Exam Triage Vital Signs ED Triage Vitals  Enc Vitals Group     BP 11/19/22 1218 117/75     Pulse Rate 11/19/22 1218 61     Resp 11/19/22 1218 16     Temp 11/19/22 1218 98.1 F (36.7 C)     Temp Source 11/19/22 1218 Oral     SpO2 11/19/22 1218 97 %     Weight --      Height --      Head Circumference --      Peak Flow --      Pain Score 11/19/22 1224 7     Pain Loc --      Pain Edu? --      Excl. in [place]? --    No data found.  Updated Vital Signs BP 117/75 (BP Location: Right Arm)   Pulse 61   Temp 98.1 F (36.7 C) (Oral)   Resp 16   SpO2 97%    Physical  Exam Vitals and nursing note reviewed.  Constitutional:      General: He is not in acute distress. HENT:     Right Ear: Tympanic membrane and ear canal normal.     Left Ear: Tympanic membrane and ear canal normal.     Nose: Congestion present. No rhinorrhea.     Mouth/Throat:     Mouth: Mucous membranes are moist.     Pharynx: Oropharynx is clear. Posterior oropharyngeal erythema present.     Tonsils: No tonsillar exudate or tonsillar abscesses. 1+ on the right. 1+ on the left.  Eyes:     Conjunctiva/sclera: Conjunctivae normal.  Cardiovascular:     Rate and Rhythm: Normal rate and regular rhythm.     Heart sounds: Normal heart sounds.  Pulmonary:     Effort: Pulmonary effort is normal.     Breath sounds: Normal breath sounds.  Abdominal:     Tenderness: There is no abdominal tenderness.  Musculoskeletal:     Cervical back: Normal range of motion.  Lymphadenopathy:     Cervical: Cervical adenopathy present.  Neurological:     Mental Status: He is alert and oriented to person, place, and time.     UC Treatments / Results  Labs (all labs ordered are listed, but only abnormal results are displayed) Labs Reviewed  CULTURE, GROUP A STREP [facility])  POCT RAPID STREP A, ED / UC    EKG   Radiology No results found.  Procedures Procedures (including critical care time)  Medications Ordered in UC Medications - No data to display  Initial Impression / Assessment and Plan / UC Course  I have reviewed the triage vital signs and the nursing notes.  Pertinent labs & imaging results that were available during my care of the patient were reviewed by me and considered in my medical decision making (see chart for details).  Afebrile here Strep negative, culture pending. Defer flu testing given out of antiviral window Recommend continue symptomatic care at home Return precautions discussed. Patient agrees to plan  Final Clinical Impressions(s) / UC Diagnoses   Final  diagnoses:  Viral illness     Discharge Instructions      Strep test negative. We will call you if anything grows on throat culture. Please continue symptomatic care and drinking lots of fluids You can add a daily nasal spray such as flonase to help with congestion     ED Prescriptions   None  PDMP not reviewed this encounter.   Les Pou, [place] 11/19/22 1432

## 2022-11-19 NOTE — Discharge Instructions (Signed)
Strep test negative. We will call you if anything grows on throat culture. Please continue symptomatic care and drinking lots of fluids You can add a daily nasal spray such as flonase to help with congestion

## 2022-11-19 NOTE — ED Triage Notes (Signed)
Pt is here for fever and SOB. Pt stated both children tested pos for flu and COVID last week. Pt is here for  fever , sore throat xfew days

## 2022-11-21 LAB — CULTURE, GROUP A STREP (THRC)

## 2022-12-01 ENCOUNTER — Ambulatory Visit: Payer: BC Managed Care – PPO | Admitting: Orthopaedic Surgery

## 2022-12-01 ENCOUNTER — Telehealth: Payer: Self-pay | Admitting: Orthopaedic Surgery

## 2022-12-01 NOTE — Telephone Encounter (Signed)
Please advise 

## 2022-12-01 NOTE — Telephone Encounter (Signed)
noted 

## 2022-12-01 NOTE — Telephone Encounter (Signed)
Patient would like Dr. Lorin Mercy to call him with the MRI results. Do to his job, it is hard to come in the office. His number is [phone]

## 2023-04-02 ENCOUNTER — Encounter (HOSPITAL_COMMUNITY): Payer: Self-pay

## 2023-04-02 ENCOUNTER — Ambulatory Visit (HOSPITAL_COMMUNITY)
Admission: EM | Admit: 2023-04-02 | Discharge: 2023-04-02 | Disposition: A | Discharge status: Home / Self Care | Payer: BC Managed Care – PPO | Attending: Family Medicine | Admitting: Family Medicine

## 2023-04-02 DIAGNOSIS — L03113 Cellulitis of right upper limb: Secondary | ICD-10-CM | POA: Diagnosis not present

## 2023-04-02 DIAGNOSIS — M7021 Olecranon bursitis, right elbow: Secondary | ICD-10-CM

## 2023-04-02 LAB — BODY FLUID CULTURE W GRAM STAIN

## 2023-04-02 LAB — SYNOVIAL FLUID, CRYSTAL: Crystals, Fluid: NONE SEEN

## 2023-04-02 MED ORDER — KETOROLAC TROMETHAMINE 10 MG PO TABS: 10.0000 mg | ORAL_TABLET | Freq: Four times a day (QID) | ORAL | 0 refills | Status: AC | PRN

## 2023-04-02 MED ORDER — AMOXICILLIN-POT CLAVULANATE 875-125 MG PO TABS: 1.0000 | ORAL_TABLET | Freq: Two times a day (BID) | ORAL | 0 refills | Status: AC

## 2023-04-02 MED ORDER — KETOROLAC TROMETHAMINE 30 MG/ML IJ SOLN
30.0000 mg | Freq: Once | INTRAMUSCULAR | Status: AC
  Administered 2023-04-02: 30 mg via INTRAMUSCULAR

## 2023-04-02 MED ORDER — LIDOCAINE HCL (PF) 1 % IJ SOLN
INTRAMUSCULAR | Status: AC
  Filled 2023-04-02: qty 2

## 2023-04-02 MED ORDER — KETOROLAC TROMETHAMINE 30 MG/ML IJ SOLN
INTRAMUSCULAR | Status: AC
  Filled 2023-04-02: qty 1

## 2023-04-02 NOTE — ED Provider Notes (Addendum)
[facility]    CSN: [id number] Arrival date & time: 04/02/23  1716      History   Chief Complaint Chief Complaint  Patient presents with   Joint Swelling    HPI Luke Mcgrath is a 37 y.o. male.   HPI Here for pain and swelling in his right elbow.  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  The olecranon area has been swelling since then and now in the last day he has had redness that is extending proximal and distal to the elbow.  No fever or chills.  He has never been diagnosed with gout.     he is taking ibuprofen which is not helping.   Of note he takes oxycodone 5 mg 4 times daily for chronic back pain.    Past Medical History:  Diagnosis Date   Anxiety    Cavernous malformation 09/11/2013   Cervical region   GERD (gastroesophageal reflux disease)     Patient Active Problem List   Diagnosis Date Noted   Other intervertebral disc degeneration, lumbar region 10/17/2022   History of fusion of cervical spine 04/13/2021   Protrusion of cervical intervertebral disc 03/25/2021   Carpal tunnel syndrome of left wrist 01/22/2018   Carpal tunnel syndrome of right wrist 01/22/2018   Prurigo nodularis 06/18/2017   GERD (gastroesophageal reflux disease) 10/06/2013   Arteriovenous fistula of spinal cord vessels 08/01/2013   Neuralgia of left thigh 07/01/2013   Chronic anxiety 07/01/2013    Past Surgical History:  Procedure Laterality Date   ANTERIOR CERVICAL DECOMP/DISCECTOMY FUSION N/A 04/13/2021   Procedure: C5-6 ANTERIOR CERVICAL DISCECTOMY FUSION, ALLOGRAFT, PLATE;  Surgeon: Eldred Manges, MD;  Location: [facility] OR;  Service: Orthopedics;  Laterality: N/A;   BRAIN SURGERY  09/11/1988   age 60- reconstruction   CARPAL TUNNEL RELEASE Left 09/2018   CERVICAL LAMINECTOMY FOR EXCISION OF NEOPLASM  09/11/2013   Cervical cavernoma at C6-7   RECONSTRUCTION OF NOSE  09/11/1992       Home Medications    Prior to Admission  medications   Medication Sig Start Date End Date Taking? Authorizing Provider  amoxicillin-clavulanate (AUGMENTIN) 875-125 MG tablet Take 1 tablet by mouth 2 (two) times daily for 7 days. 04/02/23 04/09/23 Yes Tenisha Fleece, Janace Aris, MD  ketorolac (TORADOL) 10 MG tablet Take 1 tablet (10 mg total) by mouth every 6 (six) hours as needed (pain). 04/02/23  Yes Zenia Resides, MD  aspirin EC 81 MG tablet Take 1 tablet (81 mg total) by mouth daily. Swallow whole. Patient not taking: Reported on 10/17/2022 08/10/22   Henderly, Britni A, PA-C  gabapentin (NEURONTIN) 300 MG capsule Take 300 mg by mouth 3 (three) times daily.    [provider]  oxyCODONE-acetaminophen (PERCOCET/ROXICET) 5-325 MG tablet Take 1 tablet by mouth 4 (four) times daily as needed. Patient not taking: Reported on 10/17/2022    [provider]    Family History Family History  Problem Relation Age of Onset   Healthy Mother    Hyperlipidemia Father    Hypertension Father    Diabetes Father    Cancer Maternal Grandmother        colon   Hyperlipidemia Maternal Grandmother    Other Brother        Died, Hotel manager services in [place]   Healthy Sister     Social History Social History   Tobacco Use   Smoking status: Never   Smokeless  tobacco: Never  Vaping Use   Vaping status: Never Used  Substance Use Topics   Alcohol use: Yes    Comment: 7 drinks a week   Drug use: Not Currently    Types: Marijuana     Allergies   Patient has no known allergies.   Review of Systems Review of Systems   Physical Exam Triage Vital Signs ED Triage Vitals  Encounter Vitals Group     BP 04/02/23 1757 (!) 149/90     Systolic BP Percentile --      Diastolic BP Percentile --      Pulse Rate 04/02/23 1757 79     Resp 04/02/23 1757 16     Temp 04/02/23 1757 98.5 F (36.9 C)     Temp Source 04/02/23 1757 Oral     SpO2 04/02/23 1757 97 %     Weight --      Height --      Head Circumference --      Peak Flow --       Pain Score 04/02/23 1800 7     Pain Loc --      Pain Education --      Exclude from Growth Chart --    No data found.  Updated Vital Signs BP (!) 149/90 (BP Location: Left Arm)   Pulse 79   Temp 98.5 F (36.9 C) (Oral)   Resp 16   SpO2 97%   Visual Acuity Right Eye Distance:   Left Eye Distance:   Bilateral Distance:    Right Eye Near:   Left Eye Near:    Bilateral Near:     Physical Exam Vitals reviewed.  Constitutional:      General: He is not in acute distress.    Appearance: He is not ill-appearing, toxic-appearing or diaphoretic.  Musculoskeletal:     Comments: His right olecranon bursa is swollen and soft.  There is erythema and some very mild induration of the skin extending from about 4 cm proximal to the right elbow joint onto the forearm about 5 cm.  The erythema is not dense or dark.    Skin:    Coloration: Skin is not jaundiced or pale.  Neurological:     General: No focal deficit present.     Mental Status: He is alert and oriented to person, place, and time.  Psychiatric:        Behavior: Behavior normal.      UC Treatments / Results  Labs (all labs ordered are listed, but only abnormal results are displayed) Labs Reviewed  BODY FLUID CULTURE W GRAM STAIN  SYNOVIAL FLUID, CRYSTAL    EKG   Radiology No results found.  Procedures Procedures (including critical care time)  Medications Ordered in UC Medications  ketorolac (TORADOL) 30 MG/ML injection 30 mg (30 mg Intramuscular Given 04/02/23 1851)    Initial Impression / Assessment and Plan / UC Course  I have reviewed the triage vital signs and the nursing notes.  Pertinent labs & imaging results that were available during my care of the patient were reviewed by me and considered in my medical decision making (see chart for details).       Procedure note: Drainage right olecranon bursa  Informed verbal consent is obtained after discussing risks and benefits of the following  procedure.  Under clean conditions 1% lidocaine is used for infiltration anesthesia over the roof of the olecranon bursa fluid collection. Then under sterile conditions 18-gauge needle is used  to obtain about 5 mL of somewhat cloudy slightly bloody fluid.  It is not thick and purulent. Sterile bandages applied. Fluid is sent for crystal analysis and for culture.  Augmentin is sent in to treat cellulitis and possible olecranon bursitis. Toradol is given here as an injection in Toradol pills are sent to the pharmacy.  If he worsens anyway he is to present to the emergency room for further evaluation.  If his culture is positive, he may need to go to the hospital for IV antibiotics. Final Clinical Impressions(s) / UC Diagnoses   Final diagnoses:  Olecranon bursitis of right elbow  Cellulitis of right upper extremity     Discharge Instructions      We got 5 mL of fluid out of your elbow.  We have sent it off to check for infection and to check for crystals from gout.  Staff should call you if there is anything significantly abnormal.  Results will also go to your MyChart  You have been given a shot of Toradol 30 mg today.  Take amoxicillin-clavulanate 875 mg--1 tab twice daily with food for 7 days  Ketorolac 10 mg tablets--take 1 tablet every 6 hours as needed for pain.  This is the same medicine that is in the shot we just gave you  If you begin having fever or chills or vomiting or feel that you were more and more ill, please proceed to the emergency room for further evaluation     ED Prescriptions     Medication Sig Dispense Auth. Provider   amoxicillin-clavulanate (AUGMENTIN) 875-125 MG tablet Take 1 tablet by mouth 2 (two) times daily for 7 days. 14 tablet Kymiah Araiza, Janace Aris, MD   ketorolac (TORADOL) 10 MG tablet Take 1 tablet (10 mg total) by mouth every 6 (six) hours as needed (pain). 20 tablet Ashden Sonnenberg, Janace Aris, MD      I have reviewed the PDMP during this  encounter.   Zenia Resides, MD 04/02/23 1844    Zenia Resides, MD 04/02/23 [id number]

## 2023-04-02 NOTE — Discharge Instructions (Signed)
We got 5 mL of fluid out of your elbow.  We have sent it off to check for infection and to check for crystals from gout.  Staff should call you if there is anything significantly abnormal.  Results will also go to your MyChart  You have been given a shot of Toradol 30 mg today.  Take amoxicillin-clavulanate 875 mg--1 tab twice daily with food for 7 days  Ketorolac 10 mg tablets--take 1 tablet every 6 hours as needed for pain.  This is the same medicine that is in the shot we just gave you  If you begin having fever or chills or vomiting or feel that you were more and more ill, please proceed to the emergency room for further evaluation

## 2023-04-02 NOTE — ED Triage Notes (Signed)
Patient reports that his had right elbow pain and swelling afater hitting his right elbow on an escalator in a hospital. Patient has redness that extends into the right forearm.  Patient states he has been taking ibuprofen

## 2023-04-03 LAB — BODY FLUID CULTURE W GRAM STAIN

## 2023-04-04 LAB — BODY FLUID CULTURE W GRAM STAIN

## 2023-04-05 LAB — BODY FLUID CULTURE W GRAM STAIN

## 2023-04-11 ENCOUNTER — Encounter: Payer: Self-pay | Admitting: Family Medicine

## 2023-04-11 ENCOUNTER — Ambulatory Visit (INDEPENDENT_AMBULATORY_CARE_PROVIDER_SITE_OTHER): Payer: BC Managed Care – PPO | Admitting: Family Medicine

## 2023-04-11 VITALS — BP 144/100 | HR 82 | Temp 97.6°F | Ht 72.0 in | Wt 160.7 lb

## 2023-04-11 DIAGNOSIS — M7021 Olecranon bursitis, right elbow: Secondary | ICD-10-CM | POA: Diagnosis not present

## 2023-04-11 MED ORDER — DOXYCYCLINE HYCLATE 100 MG PO CAPS: 100.0000 mg | ORAL_CAPSULE | Freq: Two times a day (BID) | ORAL | 0 refills | Status: AC

## 2023-04-11 NOTE — Progress Notes (Unsigned)
Established Patient Office Visit  Subjective   Patient ID: Luke Mcgrath, male    DOB: Oct 12, 1985  Age: 37 y.o. MRN: [id number]  Chief Complaint  Patient presents with   Hospitalization Follow-up    HPI  {History (Optional):23778} Luke Mcgrath seen with left olecranon swelling.  He had injury on 19 July.  Repairs escalators and elevators.  Apparently hit his left elbow and had very small cut olecranon region.  This was followed by some erythema and swelling.  He went to urgent care on the 22nd and some fluid was drained off and sent for crystal analysis was came back negative.  Culture did grow out Staph aureus.  He was placed on Augmentin and completed 7 days.  Swelling and redness have gone down some.  Denies any fevers or chills.  No history of known MRSA.  Still has some olecranon swelling.  Very little pain.  Past Medical History:  Diagnosis Date   Anxiety    Cavernous malformation 09/11/2013   Cervical region   GERD (gastroesophageal reflux disease)    Past Surgical History:  Procedure Laterality Date   ANTERIOR CERVICAL DECOMP/DISCECTOMY FUSION N/A 04/13/2021   Procedure: C5-6 ANTERIOR CERVICAL DISCECTOMY FUSION, ALLOGRAFT, PLATE;  Surgeon: Eldred Manges, MD;  Location: [facility];  Service: Orthopedics;  Laterality: N/A;   BRAIN SURGERY  09/11/1988   age 33- reconstruction   CARPAL TUNNEL RELEASE Left 09/2018   CERVICAL LAMINECTOMY FOR EXCISION OF NEOPLASM  09/11/2013   Cervical cavernoma at C6-7   RECONSTRUCTION OF NOSE  09/11/1992    reports that he has never smoked. He has never used smokeless tobacco. He reports current alcohol use. He reports that he does not currently use drugs after having used the following drugs: Marijuana. family history includes Cancer in his maternal grandmother; Diabetes in his father; Healthy in his mother and sister; Hyperlipidemia in his father and maternal grandmother; Hypertension in his father; Other in his brother. No Known Allergies  Review  of Systems  Constitutional:  Negative for chills and fever.      Objective:     BP (!) 144/100 (BP Location: Left Arm, Patient Position: Sitting, Cuff Size: Normal)   Pulse 82   Temp 97.6 F (36.4 C) (Oral)   Ht 6' (1.829 m)   Wt 160 lb 11.2 oz (72.9 kg)   SpO2 98%   BMI 21.79 kg/m  {Vitals History (Optional):23777}  Physical Exam Vitals reviewed.  Constitutional:      Appearance: Normal appearance.  Cardiovascular:     Rate and Rhythm: Normal rate and regular rhythm.  Musculoskeletal:     Comments: Right olecranon bursa reveals some edema.  Nontender to palpation.  Pinkish colored tissue around the bursa and extending somewhat distally a few centimeters below the elbow region.  No significant warmth.  No visible swelling or erythema proximal to the elbow.  Full range of motion right elbow  Neurological:     Mental Status: He is alert.      No results found for any visits on 04/11/23.  {Labs (Optional):23779}  The ASCVD Risk score (Arnett DK, et al., 2019) failed to calculate for the following reasons:   The 2019 ASCVD risk score is only valid for ages 17 to 74    Assessment & Plan:   Right olecranon bursitis.  Differential was traumatic bursitis with possible recent olecranon infection although apparently he has had dramatic improvement following Augmentin.  He has just a little bit of residual pinkness just distal  to the joint.  Culture did grow out Staph aureus.  We discussed covering with 7 more days of doxycycline 100 mg twice daily.  Follow-up promptly for any increased redness, swelling, or warmth.  He is aware fluid in the bursa may take several weeks to fully resolved.  Try to keep pressure off the bursa is much as possible  Evelena Peat, MD

## 2023-04-11 NOTE — Patient Instructions (Signed)
Follow up for any increased redness, warmth, or swelling.

## 2023-07-08 IMAGING — RF DG CERVICAL SPINE 2 OR 3 VIEWS
1 series · 2 of 2 positions shown · non-contrast
Comparison: Cervical spine MRI 03/17/2021.

CLINICAL DATA: Surgery, elective N3K.4 ([id number]). Additional
history provided by [HOSPITAL]-6 anterior cervical discectomy
fusion, allograft, plate. Provided fluoroscopy time 10 seconds (0.88
mGy).

EXAM:
CERVICAL SPINE - 2-3 VIEW; DG C-ARM 1-60 MIN

[Series 1: unknown protocol · 0.14mm/px · 2 of 2 slices shown]
[im 1/2]
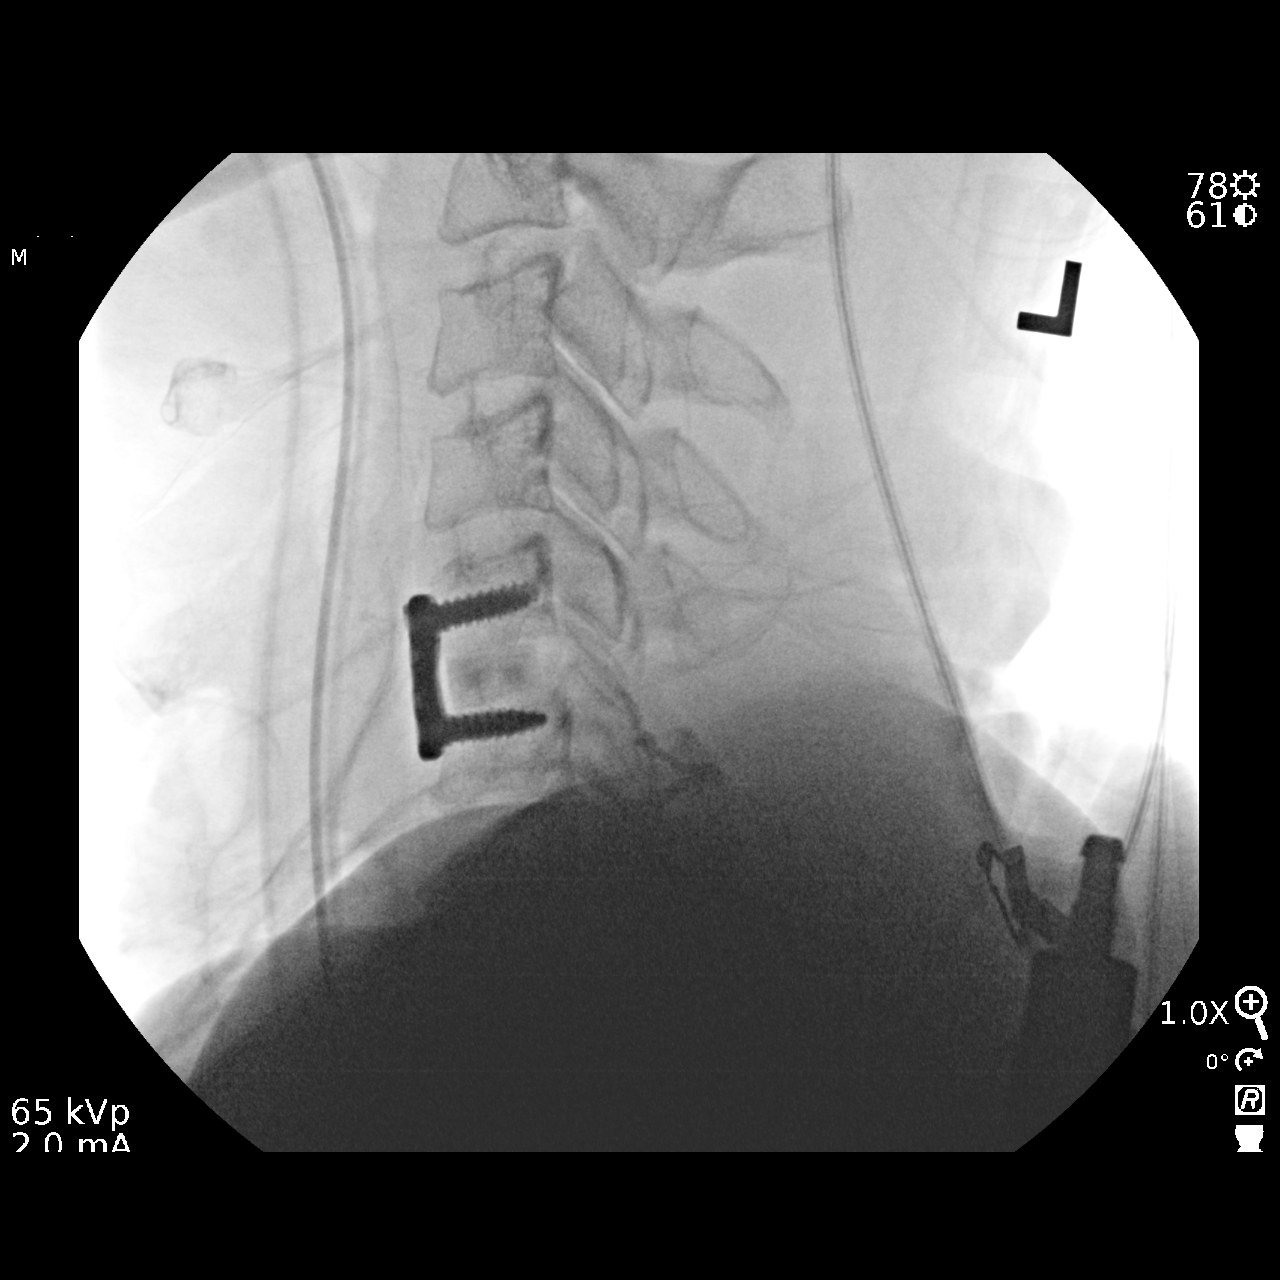
[im 2/2]
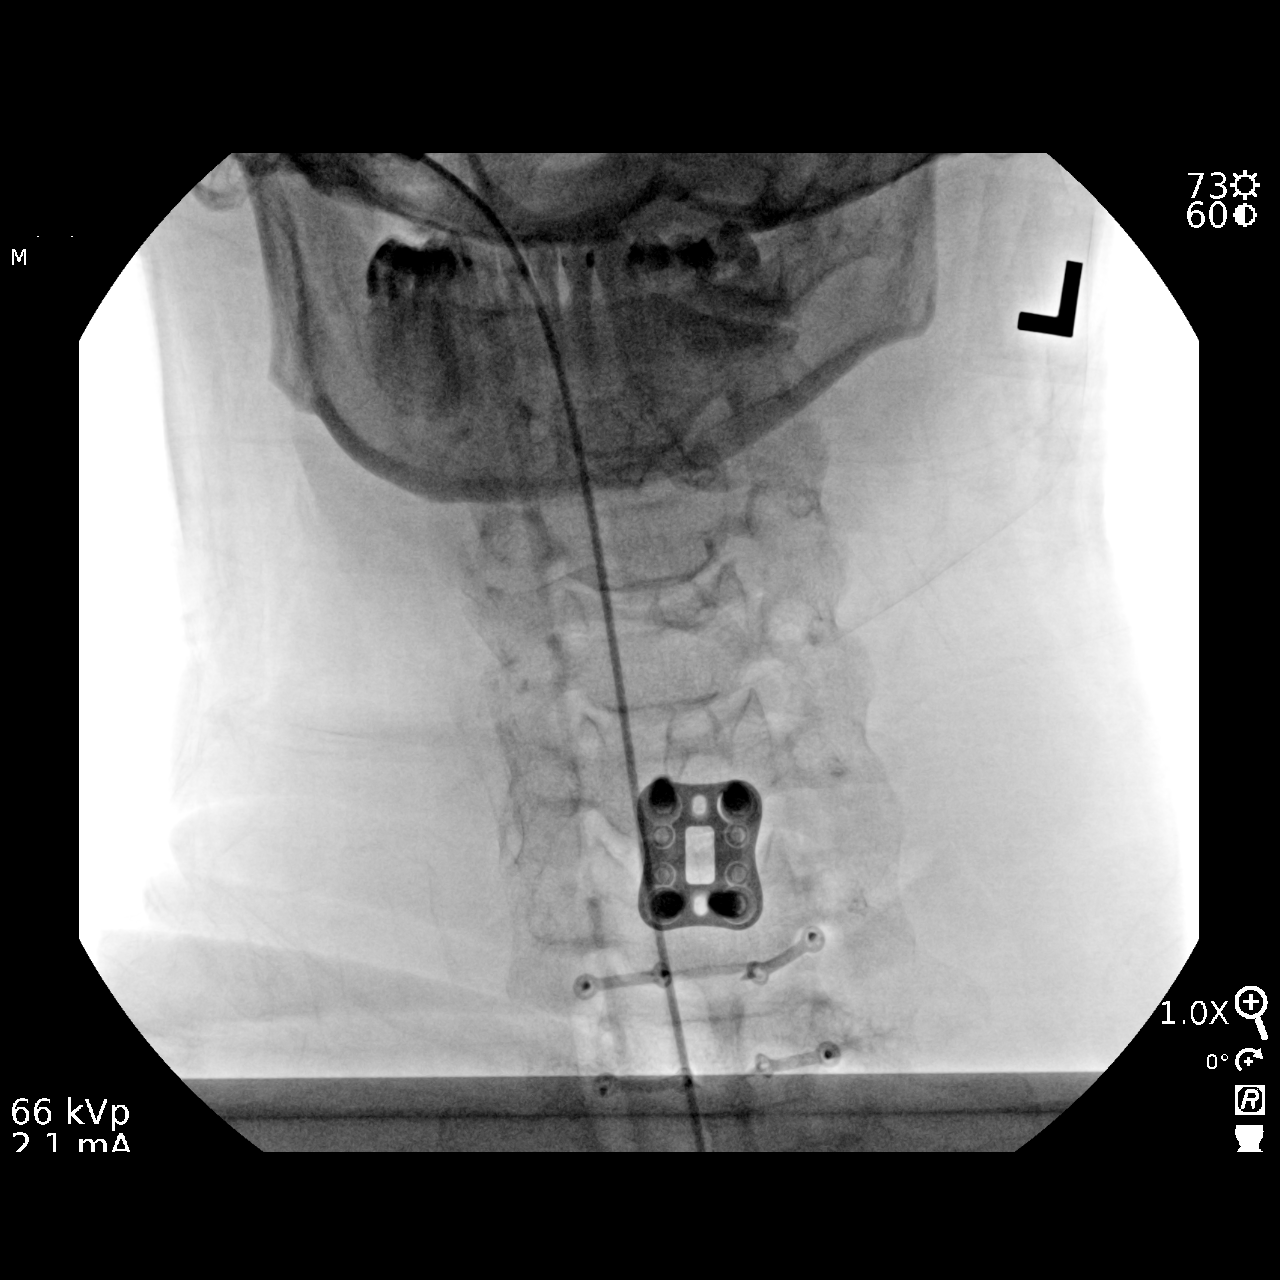

[2 of 2 positions shown; findings below may reference images not displayed]

FINDINGS: PA and lateral view intraoperative fluoroscopic images of the
cervical spine are submitted, 2 images total. On the provided
images, ACDF hardware is present at the C5-C6 level (ventral plate
and screws, as well as interbody device). Redemonstrated hardware
along the posterior elements at C6 and C7. Partially visualized ET
tube.
IMPRESSION: Two intraoperative fluoroscopic images of the cervical spine from
C5-C6 ACDF.

## 2024-03-25 IMAGING — DX DG ANKLE COMPLETE 3+V*L*
3 series · 3 of 3 positions shown · non-contrast
Comparison: None.

CLINICAL DATA: Left ankle pain for 1 month

EXAM:
LEFT ANKLE COMPLETE - 3+ VIEW

[ankle ap]
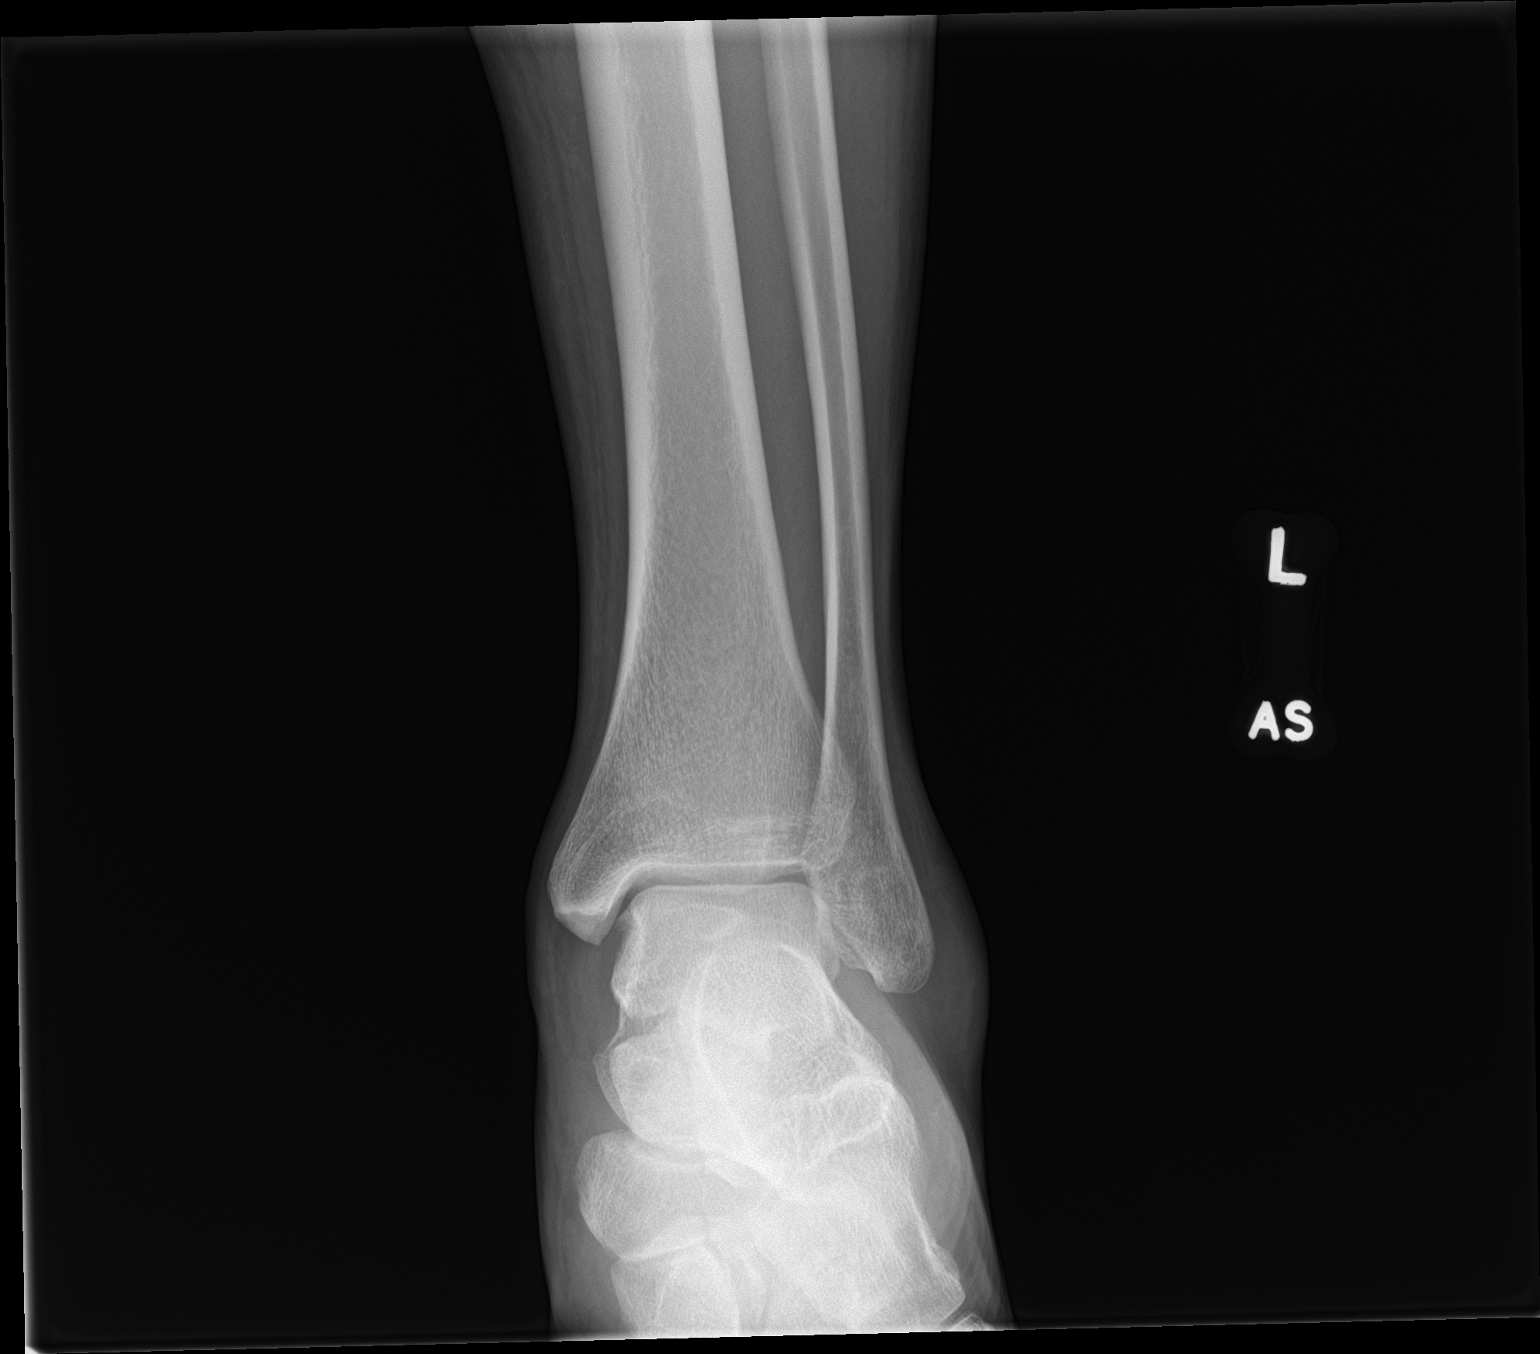

[ankle obl]
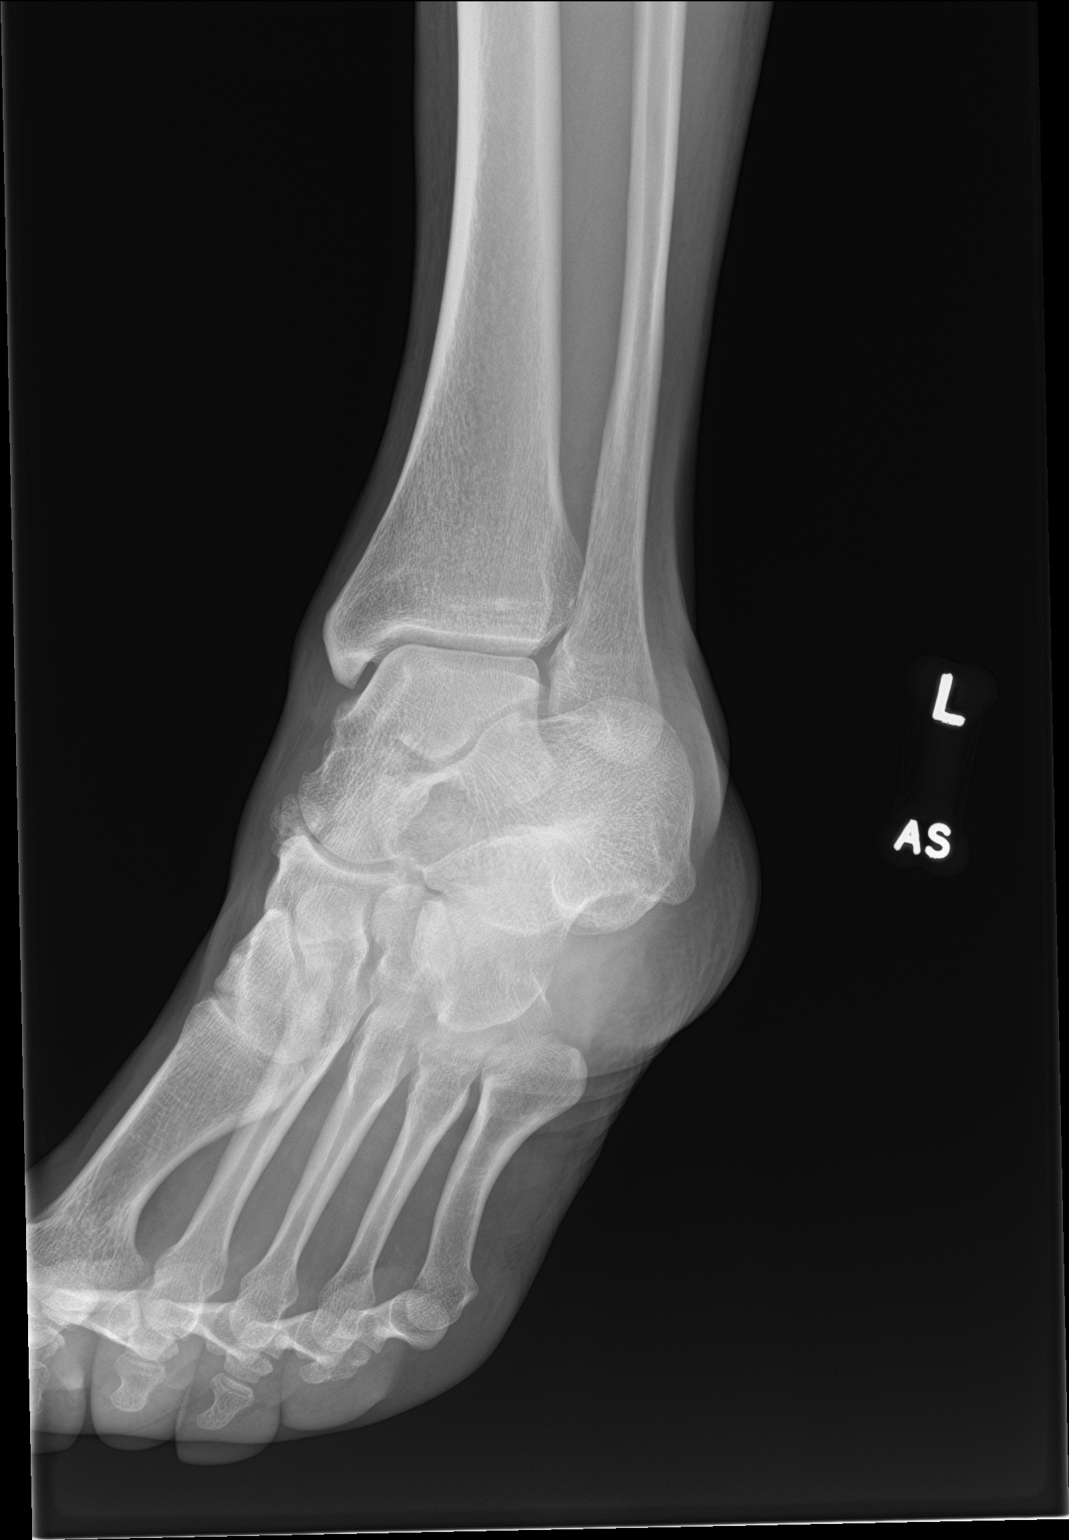

[ankle lat]
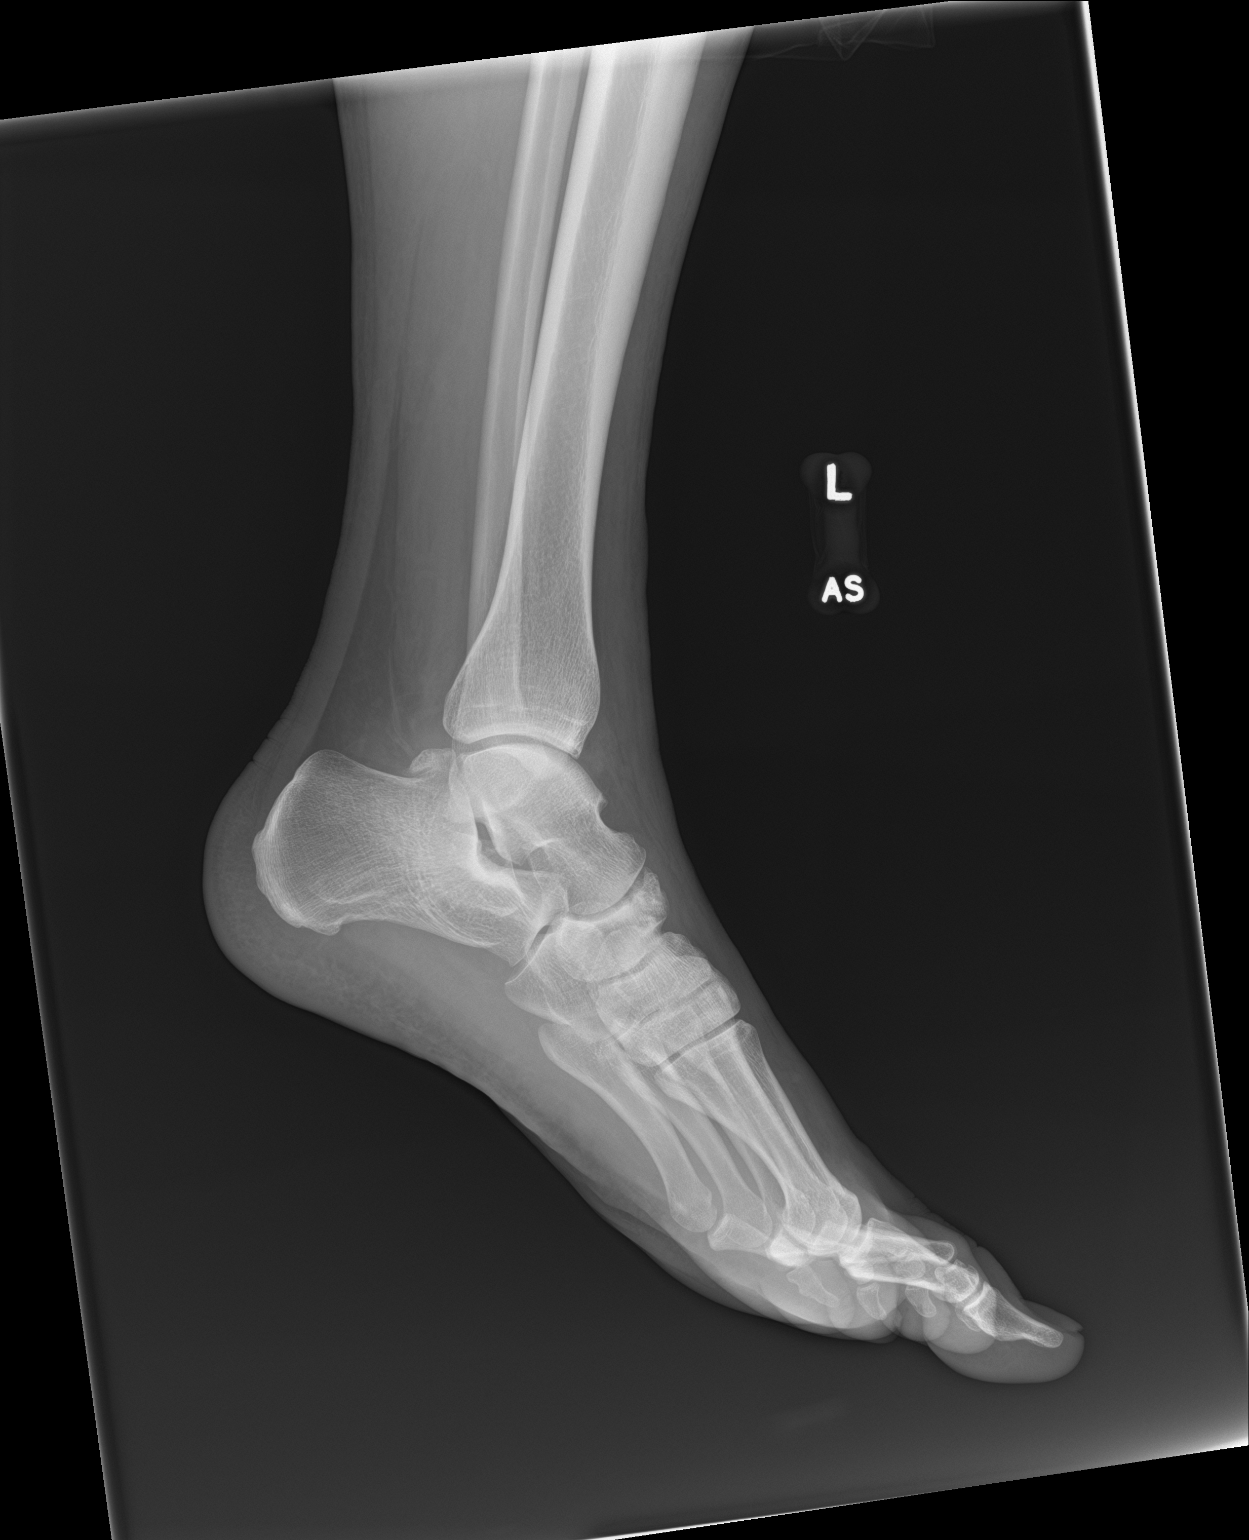

[3 of 3 positions shown; findings below may reference images not displayed]

FINDINGS: There is no evidence of fracture, dislocation, or joint effusion.
There is no evidence of arthropathy or other focal bone abnormality.
Soft tissues are unremarkable.
IMPRESSION: Negative.

## 2024-04-30 ENCOUNTER — Ambulatory Visit: Admitting: Family Medicine
